# Patient Record
Sex: Female | Born: 1965
Health system: Southern US, Community
[De-identification: ages and names within clinical notes are randomized; demographics above are authoritative.]

## PROBLEM LIST (undated history)

## (undated) DIAGNOSIS — K219 Gastro-esophageal reflux disease without esophagitis: Secondary | ICD-10-CM

## (undated) DIAGNOSIS — J302 Other seasonal allergic rhinitis: Secondary | ICD-10-CM

## (undated) DIAGNOSIS — T148XXA Other injury of unspecified body region, initial encounter: Secondary | ICD-10-CM

## (undated) DIAGNOSIS — F419 Anxiety disorder, unspecified: Secondary | ICD-10-CM

## (undated) DIAGNOSIS — J189 Pneumonia, unspecified organism: Secondary | ICD-10-CM

## (undated) DIAGNOSIS — R51 Headache: Secondary | ICD-10-CM

## (undated) DIAGNOSIS — M1990 Unspecified osteoarthritis, unspecified site: Secondary | ICD-10-CM

## (undated) DIAGNOSIS — J45909 Unspecified asthma, uncomplicated: Secondary | ICD-10-CM

## (undated) DIAGNOSIS — R609 Edema, unspecified: Secondary | ICD-10-CM

## (undated) DIAGNOSIS — F32A Depression, unspecified: Secondary | ICD-10-CM

## (undated) DIAGNOSIS — F329 Major depressive disorder, single episode, unspecified: Secondary | ICD-10-CM

## (undated) HISTORY — PX: OTHER SURGICAL HISTORY: SHX169

## (undated) HISTORY — PX: CHOLECYSTECTOMY: SHX55

## (undated) HISTORY — PX: ABDOMINAL HYSTERECTOMY: SHX81

## (undated) HISTORY — PX: TONSILLECTOMY: SUR1361

---

## 2001-02-16 ENCOUNTER — Other Ambulatory Visit: Admission: RE | Admit: 2001-02-16 | Discharge: 2001-02-16 | Payer: Self-pay | Admitting: Obstetrics and Gynecology

## 2001-07-26 ENCOUNTER — Encounter: Payer: Self-pay | Admitting: Family Medicine

## 2001-07-26 ENCOUNTER — Ambulatory Visit (HOSPITAL_COMMUNITY): Admission: RE | Admit: 2001-07-26 | Discharge: 2001-07-26 | Payer: Self-pay | Admitting: Family Medicine

## 2002-12-22 ENCOUNTER — Ambulatory Visit (HOSPITAL_COMMUNITY): Admission: RE | Admit: 2002-12-22 | Discharge: 2002-12-22 | Payer: Self-pay | Admitting: Family Medicine

## 2002-12-22 ENCOUNTER — Encounter: Payer: Self-pay | Admitting: Family Medicine

## 2003-01-09 ENCOUNTER — Encounter: Payer: Self-pay | Admitting: Family Medicine

## 2003-01-09 ENCOUNTER — Ambulatory Visit (HOSPITAL_COMMUNITY): Admission: RE | Admit: 2003-01-09 | Discharge: 2003-01-09 | Payer: Self-pay | Admitting: Family Medicine

## 2003-01-18 ENCOUNTER — Ambulatory Visit (HOSPITAL_COMMUNITY): Admission: RE | Admit: 2003-01-18 | Discharge: 2003-01-18 | Payer: Self-pay | Admitting: Family Medicine

## 2003-01-18 ENCOUNTER — Encounter: Payer: Self-pay | Admitting: Family Medicine

## 2003-03-16 ENCOUNTER — Observation Stay (HOSPITAL_COMMUNITY): Admission: RE | Admit: 2003-03-16 | Discharge: 2003-03-17 | Payer: Self-pay | Admitting: Obstetrics and Gynecology

## 2003-04-04 ENCOUNTER — Encounter: Payer: Self-pay | Admitting: Obstetrics and Gynecology

## 2003-04-04 ENCOUNTER — Ambulatory Visit (HOSPITAL_COMMUNITY): Admission: RE | Admit: 2003-04-04 | Discharge: 2003-04-04 | Payer: Self-pay | Admitting: Obstetrics and Gynecology

## 2004-09-16 ENCOUNTER — Ambulatory Visit (HOSPITAL_COMMUNITY): Admission: RE | Admit: 2004-09-16 | Discharge: 2004-09-16 | Payer: Self-pay | Admitting: Internal Medicine

## 2005-03-06 ENCOUNTER — Ambulatory Visit (HOSPITAL_COMMUNITY): Admission: RE | Admit: 2005-03-06 | Discharge: 2005-03-06 | Payer: Self-pay | Admitting: Family Medicine

## 2005-03-06 ENCOUNTER — Ambulatory Visit: Payer: Self-pay | Admitting: Orthopedic Surgery

## 2005-04-02 ENCOUNTER — Ambulatory Visit: Payer: Self-pay | Admitting: Orthopedic Surgery

## 2006-03-09 ENCOUNTER — Ambulatory Visit: Payer: Self-pay | Admitting: Orthopedic Surgery

## 2006-04-13 ENCOUNTER — Ambulatory Visit: Payer: Self-pay | Admitting: Orthopedic Surgery

## 2006-04-14 ENCOUNTER — Encounter (HOSPITAL_COMMUNITY): Admission: RE | Admit: 2006-04-14 | Discharge: 2006-05-14 | Payer: Self-pay | Admitting: Orthopedic Surgery

## 2006-05-21 ENCOUNTER — Encounter (HOSPITAL_COMMUNITY): Admission: RE | Admit: 2006-05-21 | Discharge: 2006-05-23 | Payer: Self-pay | Admitting: Orthopedic Surgery

## 2006-05-25 ENCOUNTER — Ambulatory Visit: Payer: Self-pay | Admitting: Orthopedic Surgery

## 2006-06-09 ENCOUNTER — Encounter: Admission: RE | Admit: 2006-06-09 | Discharge: 2006-06-09 | Payer: Self-pay | Admitting: Orthopedic Surgery

## 2006-07-03 ENCOUNTER — Encounter: Admission: RE | Admit: 2006-07-03 | Discharge: 2006-07-03 | Payer: Self-pay | Admitting: Internal Medicine

## 2006-07-06 ENCOUNTER — Ambulatory Visit: Payer: Self-pay | Admitting: Orthopedic Surgery

## 2006-07-31 ENCOUNTER — Encounter: Admission: RE | Admit: 2006-07-31 | Discharge: 2006-07-31 | Payer: Self-pay | Admitting: Orthopedic Surgery

## 2006-08-04 ENCOUNTER — Encounter (HOSPITAL_COMMUNITY): Admission: RE | Admit: 2006-08-04 | Discharge: 2006-09-03 | Payer: Self-pay | Admitting: Endocrinology

## 2006-08-13 ENCOUNTER — Ambulatory Visit: Payer: Self-pay | Admitting: Orthopedic Surgery

## 2007-02-11 ENCOUNTER — Ambulatory Visit: Payer: Self-pay | Admitting: Orthopedic Surgery

## 2007-06-10 ENCOUNTER — Ambulatory Visit: Payer: Self-pay | Admitting: Orthopedic Surgery

## 2007-06-10 DIAGNOSIS — M76899 Other specified enthesopathies of unspecified lower limb, excluding foot: Secondary | ICD-10-CM | POA: Insufficient documentation

## 2007-11-05 ENCOUNTER — Ambulatory Visit (HOSPITAL_COMMUNITY): Admission: RE | Admit: 2007-11-05 | Discharge: 2007-11-05 | Payer: Self-pay | Admitting: Surgery

## 2007-11-23 ENCOUNTER — Encounter (HOSPITAL_COMMUNITY): Admission: RE | Admit: 2007-11-23 | Discharge: 2007-12-23 | Payer: Self-pay | Admitting: Surgery

## 2007-12-27 ENCOUNTER — Encounter (HOSPITAL_COMMUNITY): Admission: RE | Admit: 2007-12-27 | Discharge: 2008-01-26 | Payer: Self-pay | Admitting: Surgery

## 2008-05-12 ENCOUNTER — Encounter: Admission: RE | Admit: 2008-05-12 | Discharge: 2008-05-12 | Payer: Self-pay | Admitting: Family Medicine

## 2008-06-30 ENCOUNTER — Encounter: Admission: RE | Admit: 2008-06-30 | Discharge: 2008-06-30 | Payer: Self-pay | Admitting: Cardiology

## 2009-04-04 ENCOUNTER — Ambulatory Visit (HOSPITAL_COMMUNITY): Admission: RE | Admit: 2009-04-04 | Discharge: 2009-04-04 | Payer: Self-pay | Admitting: Family Medicine

## 2009-04-16 ENCOUNTER — Ambulatory Visit: Payer: Self-pay | Admitting: Orthopedic Surgery

## 2009-04-16 DIAGNOSIS — M25519 Pain in unspecified shoulder: Secondary | ICD-10-CM

## 2009-04-16 DIAGNOSIS — S40029A Contusion of unspecified upper arm, initial encounter: Secondary | ICD-10-CM

## 2009-05-08 ENCOUNTER — Encounter (HOSPITAL_COMMUNITY): Admission: RE | Admit: 2009-05-08 | Discharge: 2009-05-24 | Payer: Self-pay | Admitting: Orthopedic Surgery

## 2009-05-08 ENCOUNTER — Encounter: Payer: Self-pay | Admitting: Orthopedic Surgery

## 2009-05-28 ENCOUNTER — Encounter (HOSPITAL_COMMUNITY): Admission: RE | Admit: 2009-05-28 | Discharge: 2009-06-27 | Payer: Self-pay | Admitting: Orthopedic Surgery

## 2009-05-29 ENCOUNTER — Ambulatory Visit: Payer: Self-pay | Admitting: Orthopedic Surgery

## 2009-05-29 DIAGNOSIS — M7580 Other shoulder lesions, unspecified shoulder: Secondary | ICD-10-CM

## 2009-06-05 ENCOUNTER — Encounter: Payer: Self-pay | Admitting: Orthopedic Surgery

## 2009-06-27 ENCOUNTER — Ambulatory Visit (HOSPITAL_COMMUNITY): Admission: RE | Admit: 2009-06-27 | Discharge: 2009-06-27 | Payer: Self-pay | Admitting: Family Medicine

## 2009-07-02 ENCOUNTER — Ambulatory Visit (HOSPITAL_COMMUNITY): Admission: RE | Admit: 2009-07-02 | Discharge: 2009-07-02 | Payer: Self-pay | Admitting: Family Medicine

## 2009-07-11 ENCOUNTER — Encounter: Payer: Self-pay | Admitting: Orthopedic Surgery

## 2009-08-22 ENCOUNTER — Ambulatory Visit: Payer: Self-pay | Admitting: Orthopedic Surgery

## 2009-08-22 DIAGNOSIS — M25569 Pain in unspecified knee: Secondary | ICD-10-CM

## 2009-09-06 ENCOUNTER — Telehealth: Payer: Self-pay | Admitting: Orthopedic Surgery

## 2009-12-05 ENCOUNTER — Ambulatory Visit: Payer: Self-pay | Admitting: Orthopedic Surgery

## 2009-12-05 DIAGNOSIS — M5430 Sciatica, unspecified side: Secondary | ICD-10-CM

## 2009-12-05 DIAGNOSIS — M5126 Other intervertebral disc displacement, lumbar region: Secondary | ICD-10-CM

## 2009-12-10 ENCOUNTER — Telehealth: Payer: Self-pay | Admitting: Orthopedic Surgery

## 2009-12-12 ENCOUNTER — Ambulatory Visit (HOSPITAL_COMMUNITY): Admission: RE | Admit: 2009-12-12 | Discharge: 2009-12-12 | Payer: Self-pay | Admitting: Orthopedic Surgery

## 2009-12-18 ENCOUNTER — Ambulatory Visit: Payer: Self-pay | Admitting: Orthopedic Surgery

## 2009-12-18 DIAGNOSIS — M479 Spondylosis, unspecified: Secondary | ICD-10-CM | POA: Insufficient documentation

## 2010-01-02 ENCOUNTER — Encounter: Payer: Self-pay | Admitting: Orthopedic Surgery

## 2010-01-02 ENCOUNTER — Encounter (HOSPITAL_COMMUNITY): Admission: RE | Admit: 2010-01-02 | Discharge: 2010-02-01 | Payer: Self-pay | Admitting: Orthopedic Surgery

## 2010-02-05 ENCOUNTER — Encounter (HOSPITAL_COMMUNITY): Admission: RE | Admit: 2010-02-05 | Discharge: 2010-03-07 | Payer: Self-pay | Admitting: Orthopedic Surgery

## 2010-02-05 ENCOUNTER — Encounter (INDEPENDENT_AMBULATORY_CARE_PROVIDER_SITE_OTHER): Payer: Self-pay | Admitting: *Deleted

## 2010-03-04 ENCOUNTER — Encounter: Payer: Self-pay | Admitting: Orthopedic Surgery

## 2010-03-06 ENCOUNTER — Ambulatory Visit: Payer: Self-pay | Admitting: Orthopedic Surgery

## 2010-03-06 DIAGNOSIS — S93409A Sprain of unspecified ligament of unspecified ankle, initial encounter: Secondary | ICD-10-CM | POA: Insufficient documentation

## 2010-09-24 NOTE — Assessment & Plan Note (Signed)
Summary: RECK BACK AFTER PT/BCBS/BSF   Visit Type:  Follow-up  CC:  recheck Back.  History of Present Illness: I saw Danielle Montoya in the office today for a followup visit.  She is a 45 years old woman with the complaint of:  right hip pain and back pain   MRI [facility] 12/12/09  Neurontin 100 three times a day  The MRI shows that she has spondylosis with bulging disc at L4-L5 and then a disc protrusion at L5-S1. No central or foraminal narrowing.  Today is a recheck after PT.  PT discharge in EMR for no shows, went for several visits, did not help, did HEP.  No hip pain today, only right knee pain.  She has a brace on right ankle after she slipped and hit her ankle 3 weeks ago, used a cam walker she had and now in ASO which helps, just FYI.           Allergies: 1)  ! Penicillin 2)  ! Aspirin 3)  ! Erythromycin  Physical Exam  Additional Exam:  medial ankle tenderness and swelling stable ankle normal range of motion, motor 5 over 5 no instability ambulation normal sensation normal  RIGHT knee stable no crepitus full range of motion no swelling   Impression & Recommendations:  Problem # 1:  SPONDYLOSIS (ICD-721.90) Assessment Improved  continue same medications follow up as needed  Orders: Est. Patient Level III (21308)  Problem # 2:  ANKLE SPRAIN (ICD-845.00) Assessment: New  Ankle sprain no x-ray needed patient has improved  Orders: Est. Patient Level III (65784)  Patient Instructions: 1)  continue neurontin  2)  ankle should  be fine  Prescriptions: NEURONTIN 100 MG CAPS (GABAPENTIN) 1 by mouth three times a day  #60 x 2   Entered and Authorized by:   Fuller Canada MD   Signed by:   Fuller Canada MD on 03/06/2010   Method used:   Faxed to ...       [address] [phone]* (retail)       [address]       [place], [place]  [address]       Ph: [phone] or [phone]       Fax: [phone]   RxID:   [id number]

## 2010-09-24 NOTE — Progress Notes (Signed)
Summary: call from patient request for pain medication  Phone Note Call from Patient   Caller: Patient Summary of Call: Patient called to relay that Hydrocodone does not seem to help with shoulder and knee pain. Asking if any stronger medication can be presccribed.  Also asking her pharmacy to fax a request for the anti-inflammatory rx refill.  Cell # [phone] Initial call taken by: Cammie Sickle,  September 06, 2009 9:10 AM  Follow-up for Phone Call        no stronger medication but the nsaid should help Follow-up by: Fuller Canada MD,  September 06, 2009 9:59 AM  Additional Follow-up for Phone Call Additional follow up Details #1::        Advised patient. Additional Follow-up by: Cammie Sickle,  September 06, 2009 10:50 AM    Prescriptions: NABUMETONE 500 MG TABS (NABUMETONE) 1 by mouth two times a day  #60 x 1   Entered by:   Ether Griffins   Authorized by:   Fuller Canada MD   Signed by:   Ether Griffins on 09/06/2009   Method used:   Faxed to ...       [address] [phone]* (retail)       [address]       [place], [place]  [address]       Ph: [phone] or [phone]       Fax: [phone]   RxID:   [id number]

## 2010-09-24 NOTE — Progress Notes (Signed)
Summary: MRI appointment.  Phone Note Outgoing Call   Call placed by: Waldon Reining,  December 10, 2009 2:37 PM Call placed to: Patient Action Taken: Appt scheduled Summary of Call: I called to give the patient her MRI appointmnet at [facility] on 12-12-09 at 4:30. Patient has BCBS, authorization [phone] and it expires on 01-08-10. Patient will follow up here to go over her results.

## 2010-09-24 NOTE — Assessment & Plan Note (Signed)
Summary: MRI results from ap/frs   Visit Type:  Follow-up  CC:  L spine and hip pain.  History of Present Illness: I saw Danielle Montoya in the office today for a followup visit.  She is a 45 years old woman with the complaint of:  right hip pain and back pain   MRI [facility] 12/12/09 for review, L spine.  Neurontin 100 three times a day, helps some.  We have not done any physical therapy yet.  The MRI shows that she has spondylosis with bulging disc at L4-L5 and then a disc protrusion at L5-S1. No central or foraminal narrowing.  Recommend physical therapy for 6 weeks. Follow up in 8 weeks.      Allergies: 1)  ! Penicillin 2)  ! Aspirin 3)  ! Erythromycin   Impression & Recommendations:  Problem # 1:  SPONDYLOSIS (ICD-721.90) Assessment Comment Only  Orders: Est. Patient Level II ([address]109)  Other Orders: Physical Therapy Referral (PT)  Patient Instructions: 1)  PT L spine x 6 weeks  2)  Please schedule a follow-up appointment in 2 months.

## 2010-09-24 NOTE — Assessment & Plan Note (Signed)
Summary: RT HIP PAIN RAD DOWN NEEDS XR/BCBS/BSF   Visit Type:  Follow-up  CC:  right hip pain.  History of Present Illness: I saw Danielle Montoya in the office today for a followup visit.  She is a 45 years old woman with the complaint of:  right hip pain.  2008 was seen in our office for right hip pain, received injection for bursitis and started on Relafen 500bid and Tylenol 3, injection helped for bursitis.  No back pain.  Has pain in left hip that radiates to the left knee.  Vicodin 5 and Relafen 500mg  no relief.  No numbness or weakness in right leg, no groin pain.       Allergies: 1)  ! Penicillin 2)  ! Aspirin 3)  ! Erythromycin  Physical Exam  Additional Exam:   *GEN: appearance was normal   ** CDV: normal pulses temperature and no edema  * LYMPH nodes were normal   * SKIN was normal   * Neuro: normal sensation, reflexes normal. ** Psyche: AAO x 3 and mood was normal   MSK *Gait was normal  *Inspection straight-leg raise was positive. Nontender at the greater trochanter. Nontender in the lumbar spine. Motor exam is norma in the RIGHT lower extremity.      Impression & Recommendations:  Problem # 1:  SCIATICA (ICD-724.3) Assessment New  Her updated medication list for this problem includes:    Nabumetone 500 Mg Tabs (Nabumetone) .Marland Kitchen... 1 by mouth two times a day    Vicodin 5-500 Mg Tabs (Hydrocodone-acetaminophen) .Marland Kitchen... 1 by mouth q 4 p rn pain  Orders: Est. Patient Level IV (98119) Lumbosacral Spine ,2/3 views (72100)  Problem # 2:  H N P-LUMBAR (ICD-722.10) Assessment: New  Orders: Est. Patient Level IV (14782) Lumbosacral Spine ,2/3 views (72100) mild to moderate facet joint arthritic changes, spinal alignment relatively normal lordosis, maybe slightly increased.  Impression mild arthritis, lumbar spine  Medications Added to Medication List This Visit: 1)  Neurontin 100 Mg Caps (Gabapentin) .Marland Kitchen.. 1 by mouth three times a day  Patient  Instructions: 1)  MRI L spine / return for results  2)  New medication: Neurontin for the hip / kneepain  Prescriptions: NEURONTIN 100 MG CAPS (GABAPENTIN) 1 by mouth three times a day  #60 x 0   Entered and Authorized by:   Fuller Canada MD   Signed by:   Fuller Canada MD on 12/05/2009   Method used:   Print then Give to Patient   RxID:   [id number]

## 2010-09-24 NOTE — Miscellaneous (Signed)
Summary: PT Clinical evaluation  PT Clinical evaluation   Imported By: Jacklynn Ganong 01/07/2010 12:24:01  _____________________________________________________________________  External Attachment:    Type:   Image     Comment:   External Document

## 2010-09-24 NOTE — Letter (Signed)
Summary: *Orthopedic No Show Letter  [facility]  [address]  [place], [place] [address]   Phone: [phone]  Fax: [phone]      02/05/2010    Karena Kinker [address] [place], [place]  [address]     Dear Ms. Christena Flake,   Our records indicate that you missed your scheduled appointment with Dr. Beaulah Corin on 02/04/10.  Please contact this office to reschedule your appointment as soon as possible.  It is important that you keep your scheduled appointments with your physician, so we can provide you the best care possible.        Sincerely,    Dr. Terrance Mass, MD [facility] Phone [phone]

## 2010-09-24 NOTE — Miscellaneous (Signed)
Summary: Discharged from PT  Discharged from PT   Imported By: Jacklynn Ganong 03/06/2010 12:21:51  _____________________________________________________________________  External Attachment:    Type:   Image     Comment:   External Document

## 2011-01-10 NOTE — H&P (Signed)
NAME:  Danielle, Montoya                          ACCOUNT NO.:  [id number]   MEDICAL RECORD NO.:  [id number]                   PATIENT TYPE:  AMB   LOCATION:  DAY                                  FACILITY:  [facility]   PHYSICIAN:  Tilda Burrow, M.D.              DATE OF BIRTH:  [birth year]   DATE OF ADMISSION:  03/16/2003  DATE OF DISCHARGE:                                HISTORY & PHYSICAL   ADMISSION DIAGNOSES:  1. Recurrent right lower quadrant pain.  2. Cystic ovaries with adhesions.  3. Status post vaginal hysterectomy.   HISTORY OF PRESENT ILLNESS:  This 45 year old female has been seen back  several times over the past year for complaints of pelvic discomfort. She  was referred back by Dr. Phillips Odor after pelvic evaluation showed a right  ovarian cyst causing pain and discomfort.   The patient is seven years status post hysterectomy.  She did well for  several years and has had a new onset over the past three months of pelvic  discomfort. CT scan had shown some question of liver spots. MRI showed  essentially benign findings regarding the liver.   REVIEW OF SYSTEMS:  Positive for dyspareunia, prohibiting intimate sex since  May and the patient had examination in our office showing adnexal tenderness  which, on examination, is felt to be due to the closeness of the right ovary  to the vaginal cuff.  The pros and cons of surgical therapy have been  discussed and the patient declines efforts of further suppressing the  ovaries. Once previously, she was successful in suppressing the ovaries by  hormone therapy but this was discontinued and the pain recurred.  Additionally, the patient is a smoker and so the long term use of oral  contraceptives is considered undesirable.  Review of systems further is  negative for urinary symptoms. There is no stress or urge incontinence  symptoms and no rectocele complaints.   PAST MEDICAL HISTORY:  Benign surgical tonsillectomy at [facility].  Hysterectomy in 1996. Cholecystectomy in 1990.   ALLERGIES:  PENICILLIN.   MEDICATIONS:  None at present.   PHYSICAL EXAMINATION:  VITAL SIGNS:  Height 5 feet 10.5 inches, weight 180.  GENERAL:  Examination shows a tearful, energetic, Caucasian female who  appears her stated age.  HEENT:  Pupils equal, round and reactive.  NECK:  Supple. Trachea midline.  CHEST:  Clear to auscultation.  BREAST:  Normal breast examination March 14, 2003.  LUNGS:  Clear to auscultation.  ABDOMEN:  Nontender without masses at this time.  PELVIC:  Bimanual examination shows normal external genitalia. Vaginal  support is adequate. Cuff is tender on bimanual. This is due to suspected  uterine pressure.  EXTREMITIES:  Grossly normal.   IMPRESSION:  Symptomatic ovaries status post hysterectomy.   PLAN:  Laparoscopic removal of tubes and ovaries March 16, 2003.  Tilda Burrow, M.D.    JVF/MEDQ  D:  03/15/2003  T:  03/16/2003  Job:  [id number]   cc:   Corrie Mckusick, M.D.  [address]  [place]  [place] [address]  Fax: [phone]

## 2011-01-10 NOTE — Op Note (Signed)
NAME:  Danielle Montoya, Danielle Montoya                          ACCOUNT NO.:  [id number]   MEDICAL RECORD NO.:  [id number]                   PATIENT TYPE:  OBV   LOCATION:  A427                                 FACILITY:  [facility]   PHYSICIAN:  Tilda Burrow, M.D.              DATE OF BIRTH:  1965/12/28   DATE OF PROCEDURE:  03/16/2003  DATE OF DISCHARGE:  03/17/2003                                 OPERATIVE REPORT   PREOPERATIVE DIAGNOSES:  1. Recurrent pelvic pain, status post hysterectomy.  2. History of right ovarian cyst, suspected adhesions of pelvis.   POSTOPERATIVE DIAGNOSES:  1. Recurrent pelvic pain, status post hysterectomy.  2. History of right ovarian cyst, suspected adhesions of pelvis.   PROCEDURE:  Laparoscopic, bilateral salpingo-oophorectomy.   SURGEON:  Tilda Burrow, M.D.   ASSISTANT:  _____________, CST   ANESTHESIA:  General   COMPLICATIONS:  None   FINDINGS:  Relative normal appearing tubes and ovaries well up on the pelvic  sidewall, evidence of prior ovarian cysts.   DETAILS OF PROCEDURE:  The patient was taken to the operating room and  prepped and draped for combined abdominal and vaginal procedure with Foley  catheter in place, and a single-tooth tenaculum attached to the cuff for  manipulation and orientation.  The patient stated preoperatively that she  had not taken a bowel prep.   The patient had laparoscopic procedure performed first by placement of an  infraumbilical, vertical 1-cm skin incision with introduction of the Veress  needle and confirmation of intraperitoneal location by water droplet tet and  pneumoperitoneum under 12 mmHg pressure. The laparoscopic trocar was  introduced using a blunt laparoscopically guided trocar and we entered the  peritoneal cavity without difficulty or evidence of trauma.  Adhesions were  not identified.   The patient had suprapubic trocar site placed without difficulty and then  the right lower quadrant 5-mm port  was placed also.  The suprapubic port was  a 12-mm port.  We then proceeded to inspect the pelvis with the patient in  Trendelenburg position and elevated the bilateral pelvis with relative ease.   The right adnexa had some adhesions to the side of the sigmoid colon and  epiploic fat and these were taken down.  We then were able to inspect the  right adnexa and find the tube and ovary high upon the sidewall and somewhat  adherent.  The adhesions were primarily involving the tube and the ovary was  relatively free.   Nonetheless, due to symptomatology we decided to remove the tube and ovary.  The ureter could be identified on the right side through the peritoneal  surfaces and then we isolated the IP ligament and the round ligament;  transected the round ligament; and dissected free in the retroperitoneal  space sufficiently to isolate the IP ligament slightly, and then used the  harmonic scalpel to continue dissection by transecting the  IC ligaments, and  then dissected along the pelvic sidewall freeing the tube and ovary up  completely and removed the specimen.  Hemostasis was quite good.   The left tube and ovary were treated with similar fashion procedure and both  were removed by using EndoCatch bag and removed through the suprapubic site  without difficulty.   The patient then had irrigation of the pelvis, inspection for hemostasis  confirming good hemostasis and then we removed laparoscopic trocars, placed  a #0 Dexon suture at the fascial level at that suprapubic and the umbilical  site and then closed all 3 trocar sites with 4-0 Dexon at the skin level.  The patient tolerated procedure well and went to recovery room in good  condition.                                               Tilda Burrow, M.D.    JVF/MEDQ  D:  03/21/2003  T:  03/22/2003  Job:  [id number]

## 2011-03-06 ENCOUNTER — Other Ambulatory Visit (HOSPITAL_COMMUNITY): Payer: Self-pay | Admitting: Internal Medicine

## 2011-03-06 DIAGNOSIS — Z139 Encounter for screening, unspecified: Secondary | ICD-10-CM

## 2011-03-11 ENCOUNTER — Ambulatory Visit (HOSPITAL_COMMUNITY): Payer: BC Managed Care – PPO

## 2011-03-17 ENCOUNTER — Ambulatory Visit (HOSPITAL_COMMUNITY): Payer: BC Managed Care – PPO

## 2011-03-28 ENCOUNTER — Ambulatory Visit (HOSPITAL_COMMUNITY)
Admission: RE | Admit: 2011-03-28 | Discharge: 2011-03-28 | Disposition: A | Discharge status: Home / Self Care | Payer: BC Managed Care – PPO | Source: Ambulatory Visit | Attending: Internal Medicine | Admitting: Internal Medicine

## 2011-03-28 DIAGNOSIS — Z1231 Encounter for screening mammogram for malignant neoplasm of breast: Secondary | ICD-10-CM | POA: Insufficient documentation

## 2011-03-28 DIAGNOSIS — Z139 Encounter for screening, unspecified: Secondary | ICD-10-CM

## 2012-04-01 ENCOUNTER — Other Ambulatory Visit (HOSPITAL_COMMUNITY): Payer: Self-pay | Admitting: Physician Assistant

## 2012-04-01 ENCOUNTER — Encounter (INDEPENDENT_AMBULATORY_CARE_PROVIDER_SITE_OTHER): Payer: Self-pay | Admitting: *Deleted

## 2012-04-01 DIAGNOSIS — R51 Headache: Secondary | ICD-10-CM

## 2012-04-01 DIAGNOSIS — R42 Dizziness and giddiness: Secondary | ICD-10-CM

## 2012-04-05 ENCOUNTER — Ambulatory Visit (HOSPITAL_COMMUNITY)
Admission: RE | Admit: 2012-04-05 | Discharge: 2012-04-05 | Disposition: A | Discharge status: Home / Self Care | Payer: BC Managed Care – PPO | Source: Ambulatory Visit | Attending: Physician Assistant | Admitting: Physician Assistant

## 2012-04-05 DIAGNOSIS — R51 Headache: Secondary | ICD-10-CM | POA: Insufficient documentation

## 2012-04-05 DIAGNOSIS — R42 Dizziness and giddiness: Secondary | ICD-10-CM | POA: Insufficient documentation

## 2012-04-27 ENCOUNTER — Encounter (INDEPENDENT_AMBULATORY_CARE_PROVIDER_SITE_OTHER): Payer: Self-pay | Admitting: Internal Medicine

## 2012-04-27 ENCOUNTER — Other Ambulatory Visit (INDEPENDENT_AMBULATORY_CARE_PROVIDER_SITE_OTHER): Payer: Self-pay | Admitting: Internal Medicine

## 2012-04-27 ENCOUNTER — Ambulatory Visit (INDEPENDENT_AMBULATORY_CARE_PROVIDER_SITE_OTHER): Payer: BC Managed Care – PPO | Admitting: Internal Medicine

## 2012-04-27 VITALS — BP 138/82 | HR 64 | Temp 98.4°F | Ht 62.5 in | Wt 197.0 lb

## 2012-04-27 DIAGNOSIS — K921 Melena: Secondary | ICD-10-CM

## 2012-04-27 DIAGNOSIS — R197 Diarrhea, unspecified: Secondary | ICD-10-CM

## 2012-04-27 NOTE — Progress Notes (Signed)
Subjective:     Patient ID: Danielle Montoya, female   DOB: [birth year], 46 y.o.   MRN: [id number]  HPIReferred to our office by D[facility] Luo [facility] for diarrhea and blood in her stool.  Two weeks ago she had an eye infection and she was given Z-pack and then Doxycyline.  After taking antibiotics, she became sick. She began to vomit and have diarrhea. She was having approximately 10-15 loose stools.She could see blood in the toilet with her stools. The diarrhea lasted about 2 weeks and then cleared. Her stools now are loose. She is having  4 stools a day. Previously she was having 2 a day.  She tells me she is 50% better. She feels tired.  Stool studies were ordered by Robbie Lis but she did not obtain them. Her appetite has remained good. No weight loss.   Review of Systems see hpi No current outpatient prescriptions on file.   History reviewed. No pertinent past medical history. Past Surgical History  Procedure Date  . Cholecystectomy     dysfunctional  . Complete hysterectomy   . Tonsillectomy    History   Social History  . Marital Status: Married    Spouse Name: N/A    Number of Children: N/A  . Years of Education: N/A   Occupational History  . Not on file.   Social History Main Topics  . Smoking status: Current Everyday Smoker  . Smokeless tobacco: Not on file   Comment: 1/2 pack a day. Smoking since age 62.  Marland Kitchen Alcohol Use: No  . Drug Use: No  . Sexually Active: Not on file   Other Topics Concern  . Not on file   Social History Narrative  . No narrative on file   Family Status  Relation Status Death Age  . Mother Alive     Diabetic  . Father Deceased     MS  . Sister Alive     Good health. Both are diabetic  . Brother Alive     Open heart surgery age 22. Diabetic   Allergies  Allergen Reactions  . Aspirin   . Erythromycin   . Penicillins         Objective:   Physical Exam Filed Vitals:   04/27/12 1436  Height: 5' 2.5" (1.588 m)  Weight: 197 lb (89.359 kg)    Alert and oriented. Skin warm and dry. Oral mucosa is moist.   . Sclera anicteric, conjunctivae is pink. Thyroid not enlarged. No cervical lymphadenopathy. Lungs clear. Heart regular rate and rhythm.  Abdomen is soft. Bowel sounds are positive. No hepatomegaly. No abdominal masses felt. No tenderness. Stool brown and guaiac negative.  No edema to lower extremities.       Assessment:    Diarrhea.  C-difficile needs to be ruled out given recent hx of taking antibiotics. .  Mother had colon surgery in 2010 for adenoma with high grade dysplasia. May consider colonoscopy after stool studies are obtained.    Plan:    Stools studies. Further recommendations once we have stool studies back.   Will discuss with Dr .Karilyn Cota. May need a colonoscopy given family hx. I will discuss with Dr. Karilyn Cota.

## 2012-04-27 NOTE — Patient Instructions (Addendum)
Stool studies. Further recommendations once we have the results.

## 2012-04-28 ENCOUNTER — Encounter (INDEPENDENT_AMBULATORY_CARE_PROVIDER_SITE_OTHER): Payer: Self-pay | Admitting: Internal Medicine

## 2012-04-28 DIAGNOSIS — R197 Diarrhea, unspecified: Secondary | ICD-10-CM | POA: Insufficient documentation

## 2012-04-28 DIAGNOSIS — K921 Melena: Secondary | ICD-10-CM | POA: Insufficient documentation

## 2012-04-28 LAB — CLOSTRIDIUM DIFFICILE BY PCR: Toxigenic C. Difficile by PCR: NOT DETECTED

## 2012-04-28 LAB — OVA AND PARASITE SCREEN: OP: NONE SEEN

## 2012-04-28 LAB — FECAL LACTOFERRIN, QUANT: Lactoferrin: NEGATIVE

## 2012-05-01 LAB — STOOL CULTURE

## 2012-05-07 ENCOUNTER — Other Ambulatory Visit (INDEPENDENT_AMBULATORY_CARE_PROVIDER_SITE_OTHER): Payer: Self-pay | Admitting: *Deleted

## 2012-05-07 ENCOUNTER — Telehealth (INDEPENDENT_AMBULATORY_CARE_PROVIDER_SITE_OTHER): Payer: Self-pay | Admitting: *Deleted

## 2012-05-07 DIAGNOSIS — C189 Malignant neoplasm of colon, unspecified: Secondary | ICD-10-CM

## 2012-05-07 DIAGNOSIS — R195 Other fecal abnormalities: Secondary | ICD-10-CM

## 2012-05-07 DIAGNOSIS — Z800 Family history of malignant neoplasm of digestive organs: Secondary | ICD-10-CM

## 2012-05-07 MED ORDER — PEG-KCL-NACL-NASULF-NA ASC-C 100 G PO SOLR: 1.0000 | Freq: Once | ORAL | Status: DC

## 2012-05-07 NOTE — Telephone Encounter (Signed)
Patient needs movi prep 

## 2012-06-02 ENCOUNTER — Encounter (HOSPITAL_COMMUNITY): Payer: Self-pay | Admitting: Pharmacy Technician

## 2012-06-09 MED ORDER — SODIUM CHLORIDE 0.45 % IV SOLN
INTRAVENOUS | Status: DC
  Administered 2012-06-10: 11:00:00 via INTRAVENOUS

## 2012-06-10 ENCOUNTER — Encounter (HOSPITAL_COMMUNITY): Payer: Self-pay | Admitting: *Deleted

## 2012-06-10 ENCOUNTER — Encounter (HOSPITAL_COMMUNITY)
Admission: RE | Disposition: A | Discharge status: Home / Self Care | Payer: Self-pay | Source: Ambulatory Visit | Attending: Internal Medicine

## 2012-06-10 ENCOUNTER — Ambulatory Visit (HOSPITAL_COMMUNITY)
Admission: RE | Admit: 2012-06-10 | Discharge: 2012-06-10 | Disposition: A | Discharge status: Home / Self Care | Payer: BC Managed Care – PPO | Source: Ambulatory Visit | Attending: Internal Medicine | Admitting: Internal Medicine

## 2012-06-10 DIAGNOSIS — R197 Diarrhea, unspecified: Secondary | ICD-10-CM | POA: Insufficient documentation

## 2012-06-10 DIAGNOSIS — Z800 Family history of malignant neoplasm of digestive organs: Secondary | ICD-10-CM | POA: Insufficient documentation

## 2012-06-10 DIAGNOSIS — K644 Residual hemorrhoidal skin tags: Secondary | ICD-10-CM | POA: Insufficient documentation

## 2012-06-10 DIAGNOSIS — K5730 Diverticulosis of large intestine without perforation or abscess without bleeding: Secondary | ICD-10-CM | POA: Insufficient documentation

## 2012-06-10 DIAGNOSIS — K625 Hemorrhage of anus and rectum: Secondary | ICD-10-CM

## 2012-06-10 DIAGNOSIS — K5190 Ulcerative colitis, unspecified, without complications: Secondary | ICD-10-CM

## 2012-06-10 DIAGNOSIS — R195 Other fecal abnormalities: Secondary | ICD-10-CM

## 2012-06-10 DIAGNOSIS — K6389 Other specified diseases of intestine: Secondary | ICD-10-CM

## 2012-06-10 HISTORY — DX: Other seasonal allergic rhinitis: J30.2

## 2012-06-10 HISTORY — DX: Headache: R51

## 2012-06-10 HISTORY — PX: COLONOSCOPY: SHX5424

## 2012-06-10 HISTORY — DX: Gastro-esophageal reflux disease without esophagitis: K21.9

## 2012-06-10 HISTORY — DX: Other injury of unspecified body region, initial encounter: T14.8XXA

## 2012-06-10 SURGERY — COLONOSCOPY
Anesthesia: Moderate Sedation

## 2012-06-10 MED ORDER — MIDAZOLAM HCL 5 MG/5ML IJ SOLN
INTRAMUSCULAR | Status: AC
  Filled 2012-06-10: qty 10

## 2012-06-10 MED ORDER — MIDAZOLAM HCL 5 MG/5ML IJ SOLN
INTRAMUSCULAR | Status: DC | PRN
  Administered 2012-06-10 (×4): 2 mg via INTRAVENOUS

## 2012-06-10 MED ORDER — MEPERIDINE HCL 50 MG/ML IJ SOLN
INTRAMUSCULAR | Status: AC
  Filled 2012-06-10: qty 1

## 2012-06-10 MED ORDER — DICYCLOMINE HCL 10 MG PO CAPS: 10.0000 mg | ORAL_CAPSULE | Freq: Three times a day (TID) | ORAL | Status: DC

## 2012-06-10 MED ORDER — STERILE WATER FOR IRRIGATION IR SOLN
Status: DC | PRN
  Administered 2012-06-10: 11:00:00

## 2012-06-10 MED ORDER — MEPERIDINE HCL 50 MG/ML IJ SOLN
INTRAMUSCULAR | Status: DC | PRN
  Administered 2012-06-10 (×2): 25 mg via INTRAVENOUS

## 2012-06-10 NOTE — Op Note (Signed)
COLONOSCOPY PROCEDURE REPORT  PATIENT:  Danielle Montoya  MR#:  [id number] Birthdate:  May 15, 1966, 46 y.o., female Endoscopist:  Dr. Malissa Hippo, MD Referred By:  Dr. Madelin Rear. Sherwood Gambler, MD Procedure Date: 06/10/2012  Procedure:   Colonoscopy  Indications:  Patient is 46 year old Caucasian female with several week history of diarrhea negative stool studies. She also has noted intermittent rectal bleeding. Patient has history of thrombocytopenia. Her platelet count of 2 months ago was 165K per Dr. Sherwood Gambler.  Informed Consent:  The procedure and risks were reviewed with the patient and informed consent was obtained.  Medications:  Demerol 50 mg IV Versed 8 mg IV  Description of procedure:  After a digital rectal exam was performed, that colonoscope was advanced from the anus through the rectum and colon to the area of the cecum, ileocecal valve and appendiceal orifice. The cecum was deeply intubated. These structures were well-seen and photographed for the record. From the level of the cecum and ileocecal valve, the scope was slowly and cautiously withdrawn. The mucosal surfaces were carefully surveyed utilizing scope tip to flexion to facilitate fold flattening as needed. The scope was pulled down into the rectum where a thorough exam including retroflexion was performed. Terminal ileum was also examined.  Findings:   Prep excellent. Normal mucosa of terminal ileum. Scattered erosions noted at cecum and transverse colon. Biopsy taken from cecal mucosa with erosions. Few small diverticula at sigmoid colon. Normal rectal mucosa. Small hemorrhoids below the dentate line.  Therapeutic/Diagnostic Maneuvers Performed:  See above  Complications:  None  Cecal Withdrawal Time:  9 minutes  Impression:  Normal terminal ileum. Scattered erosions at cecum and transverse colon possibly residual from recent bout with colitis. Biopsy taken for routine histology. These changes could also be result  of NSAID therapy. Few small diverticula at sigmoid colon. Small external hemorrhoids.  Recommendations:  Dicyclomine 10 mg by mouth twice a day. Align or equivalent 1 capsule by mouth daily for one-month. I will contact patient with biopsy results and further recommendations.  REHMAN,NAJEEB U  06/10/2012 11:52 AM  CC: Dr. Cassell Smiles., MD & Dr. Bonnetta Barry ref. provider found

## 2012-06-10 NOTE — H&P (Signed)
Danielle Montoya is an 46 y.o. female.   Chief Complaint: Patient is here for colonoscopy. HPI: Patient is 46 year old Caucasian female with several week history of diarrhea and intermittent bleeding. Diarrhea started when she was given Z-Pak followed by doxycycline for eye infection. At one point she was having several bowel movements per day. She has improved but diarrhea has not gone away completely. She is having 4-5 stools per day. Her stool studies been negative including C. difficile toxin titer. She denies abdominal pain anorexia or weight loss. Patient is taking diclofenac for torn muscle given to her by orthopedic surgeon and she would eventually come off this medication. Patient also gives history of thrombocytopenia since 1997. She tells me she had thorough evaluation at [facility] and etiology not determined. Family history is negative for inflammatory bowel disease or CRC.  Past Medical History  Diagnosis Date  . Torn muscle     Left arm  . GERD (gastroesophageal reflux disease)   . Seasonal allergies   . Headache     Migraines    Past Surgical History  Procedure Date  . Cholecystectomy     dysfunctional  . Tonsillectomy   . Abdominal hysterectomy     Family History  Problem Relation Age of Onset  . Colon cancer Mother    Social History:  reports that she has been smoking Cigarettes.  She has a 16 pack-year smoking history. She does not have any smokeless tobacco history on file. She reports that she does not drink alcohol or use illicit drugs.  Allergies:  Allergies  Allergen Reactions  . Shellfish Allergy Nausea And Vomiting    All seafood causes patient to be extremely sick.  . Aspirin Nausea And Vomiting and Rash  . Erythromycin Nausea And Vomiting and Rash  . Penicillins Nausea And Vomiting and Rash    Medications Prior to Admission  Medication Sig Dispense Refill  . cyclobenzaprine (FLEXERIL) 10 MG tablet Take 10 mg by mouth at bedtime.      .  diclofenac (VOLTAREN) 75 MG EC tablet Take 75 mg by mouth 2 (two) times daily.      . fluticasone (FLONASE) 50 MCG/ACT nasal spray Place 2 sprays into the nose daily as needed.      . peg 3350 powder (MOVIPREP) 100 G SOLR Take 1 kit (100 g total) by mouth once.  1 kit  0  . topiramate (TOPAMAX) 25 MG tablet Take 75 mg by mouth at bedtime.      Marland Kitchen HYDROcodone-acetaminophen (NORCO/VICODIN) 5-325 MG per tablet Take 1 tablet by mouth every 6 (six) hours as needed. Pain        No results found for this or any previous visit (from the past 48 hour(s)). No results found.  ROS  Blood pressure 147/99, pulse 88, temperature 98 F (36.7 C), temperature source Oral, resp. rate 17, height 5' 2.5" (1.588 m), weight 197 lb (89.359 kg), SpO2 96.00%. Physical Exam  Constitutional: She appears well-developed and well-nourished.  HENT:  Mouth/Throat: Oropharynx is clear and moist.  Eyes: Conjunctivae normal are normal. No scleral icterus.  Neck: No thyromegaly present.  Cardiovascular: Normal rate, regular rhythm and normal heart sounds.   No murmur heard. Respiratory: Effort normal and breath sounds normal.  GI: Soft. She exhibits no mass. There is no tenderness.  Musculoskeletal: She exhibits no edema.  Lymphadenopathy:    She has no cervical adenopathy.  Neurological: She is alert.  Skin: Skin is warm and dry.  Assessment/Plan Persistent diarrhea despite negative stool studies. Rectal bleeding. Diagnostic colonoscopy.  Khi Mcmillen U 06/10/2012, 11:22 AM

## 2012-06-17 ENCOUNTER — Encounter (HOSPITAL_COMMUNITY): Payer: Self-pay | Admitting: Internal Medicine

## 2012-06-18 ENCOUNTER — Encounter (INDEPENDENT_AMBULATORY_CARE_PROVIDER_SITE_OTHER): Payer: Self-pay | Admitting: *Deleted

## 2012-06-18 NOTE — Progress Notes (Signed)
Apt has been scheduled for 08/16/12 with Dr. Karilyn Cota.

## 2012-08-16 ENCOUNTER — Ambulatory Visit (INDEPENDENT_AMBULATORY_CARE_PROVIDER_SITE_OTHER): Payer: BC Managed Care – PPO | Admitting: Internal Medicine

## 2012-08-16 ENCOUNTER — Encounter (INDEPENDENT_AMBULATORY_CARE_PROVIDER_SITE_OTHER): Payer: Self-pay | Admitting: Internal Medicine

## 2012-08-16 VITALS — BP 140/80 | HR 68 | Temp 98.4°F | Resp 16 | Ht 62.0 in | Wt 194.9 lb

## 2012-08-16 DIAGNOSIS — K219 Gastro-esophageal reflux disease without esophagitis: Secondary | ICD-10-CM

## 2012-08-16 DIAGNOSIS — R197 Diarrhea, unspecified: Secondary | ICD-10-CM

## 2012-08-16 MED ORDER — DICYCLOMINE HCL 10 MG PO CAPS: 10.0000 mg | ORAL_CAPSULE | Freq: Three times a day (TID) | ORAL | Status: DC

## 2012-08-16 MED ORDER — PANTOPRAZOLE SODIUM 40 MG PO TBEC: 40.0000 mg | DELAYED_RELEASE_TABLET | Freq: Every day | ORAL | Status: DC

## 2012-08-16 NOTE — Progress Notes (Signed)
Presenting complaint;  Followup for diarrhea. Patient complains of frequent heartburn.  Subjective:  Patient is 46 year old Caucasian female who is evaluated for diarrhea and rectal bleeding. Symptoms started after she was treated with antibiotic but her stool studies were negative. On 06/10/2012 she underwent colonoscopy revealing normal terminal ileum scattered erosions at cecum and transverse colon and few small diverticula at sigmoid colon as well as small external hemorrhoids. Biopsy revealed nonspecific inflammation. This injury was felt to be either secondary to NSAIDs or an infection. She was begun on dicyclomine. She states she is feeling better. She generally has 3 bowel movements per day. Stool consistency varies from loose to formed. She denies rectal bleeding or abdominal cramps. Now she is having frequent heartburn. She is now taking Celebrex for torn: muscle at left forearm. She is presently on medical leave.  Current Medications: Current Outpatient Prescriptions  Medication Sig Dispense Refill  . celecoxib (CELEBREX) 200 MG capsule Take 200 mg by mouth 2 (two) times daily.      Marland Kitchen dicyclomine (BENTYL) 10 MG capsule Take 1 capsule (10 mg total) by mouth 4 (four) times daily -  before meals and at bedtime.  60 capsule  5  . fluticasone (FLONASE) 50 MCG/ACT nasal spray Place 2 sprays into the nose daily as needed.      Marland Kitchen oxyCODONE-acetaminophen (PERCOCET) 10-650 MG per tablet Take 1 tablet by mouth every 6 (six) hours as needed.      . topiramate (TOPAMAX) 25 MG tablet Take 75 mg by mouth at bedtime.         Objective: Blood pressure 140/80, pulse 68, temperature 98.4 F (36.9 C), temperature source Oral, resp. rate 16, height 5\' 2"  (1.575 m), weight 194 lb 14.4 oz (88.406 kg). Conjunctiva is pink. Sclera is nonicteric Oropharyngeal mucosa is normal. No neck masses or thyromegaly noted. Abdomen is full but soft and nontender without organomegaly or masses. No LE edema or  clubbing noted.  she has cast overleft forearm and elbow.   Assessment:  #1. Diarrhea. She has improved with anti-spasmodic therapy. In retrospect she there had an infection or NSAID colopathy. #2.GERD. Suspect symptoms secondary to medications.   Plan:  Pantoprazole 40 mg by mouth every morning. Decrease dicyclomine to 10 mg by mouth 3 times a day. Check with your orthopedic surgeon if Celebrex does could be reduced. Office visit on as-needed basis.

## 2012-08-16 NOTE — Patient Instructions (Addendum)
New medication is pantoprazole 40 mg by mouth 30 minutes for breakfast daily. Dicyclomine dose decreased to 10 mg before each meal. You can drop dose further as diarrhea improves. Please talk with your orthopedic surgeon if Celebrex dose could be reduced.

## 2013-10-01 ENCOUNTER — Emergency Department (HOSPITAL_COMMUNITY): Payer: BC Managed Care – PPO

## 2013-10-01 ENCOUNTER — Encounter (HOSPITAL_COMMUNITY): Payer: Self-pay | Admitting: Emergency Medicine

## 2013-10-01 ENCOUNTER — Emergency Department (HOSPITAL_COMMUNITY)
Admission: EM | Admit: 2013-10-01 | Discharge: 2013-10-02 | Disposition: A | Discharge status: Home / Self Care | Payer: BC Managed Care – PPO | Attending: Emergency Medicine | Admitting: Emergency Medicine

## 2013-10-01 DIAGNOSIS — Z90710 Acquired absence of both cervix and uterus: Secondary | ICD-10-CM | POA: Insufficient documentation

## 2013-10-01 DIAGNOSIS — R109 Unspecified abdominal pain: Secondary | ICD-10-CM

## 2013-10-01 DIAGNOSIS — Z880 Allergy status to penicillin: Secondary | ICD-10-CM | POA: Insufficient documentation

## 2013-10-01 DIAGNOSIS — Z8719 Personal history of other diseases of the digestive system: Secondary | ICD-10-CM | POA: Insufficient documentation

## 2013-10-01 DIAGNOSIS — Z87828 Personal history of other (healed) physical injury and trauma: Secondary | ICD-10-CM | POA: Insufficient documentation

## 2013-10-01 DIAGNOSIS — Z9089 Acquired absence of other organs: Secondary | ICD-10-CM | POA: Insufficient documentation

## 2013-10-01 DIAGNOSIS — E669 Obesity, unspecified: Secondary | ICD-10-CM | POA: Insufficient documentation

## 2013-10-01 DIAGNOSIS — N390 Urinary tract infection, site not specified: Secondary | ICD-10-CM | POA: Insufficient documentation

## 2013-10-01 DIAGNOSIS — G43909 Migraine, unspecified, not intractable, without status migrainosus: Secondary | ICD-10-CM | POA: Insufficient documentation

## 2013-10-01 DIAGNOSIS — R61 Generalized hyperhidrosis: Secondary | ICD-10-CM | POA: Insufficient documentation

## 2013-10-01 DIAGNOSIS — F17200 Nicotine dependence, unspecified, uncomplicated: Secondary | ICD-10-CM | POA: Insufficient documentation

## 2013-10-01 LAB — CBC WITH DIFFERENTIAL/PLATELET
BASOS ABS: 0 10*3/uL (ref 0.0–0.1)
BASOS PCT: 0 % (ref 0–1)
EOS ABS: 0.1 10*3/uL (ref 0.0–0.7)
EOS PCT: 1 % (ref 0–5)
HEMATOCRIT: 39.9 % (ref 36.0–46.0)
Hemoglobin: 13.2 g/dL (ref 12.0–15.0)
Lymphocytes Relative: 50 % — ABNORMAL HIGH (ref 12–46)
Lymphs Abs: 5 10*3/uL — ABNORMAL HIGH (ref 0.7–4.0)
MCH: 31.1 pg (ref 26.0–34.0)
MCHC: 33.1 g/dL (ref 30.0–36.0)
MCV: 94.1 fL (ref 78.0–100.0)
MONO ABS: 0.6 10*3/uL (ref 0.1–1.0)
Monocytes Relative: 6 % (ref 3–12)
Neutro Abs: 4.2 10*3/uL (ref 1.7–7.7)
Neutrophils Relative %: 42 % — ABNORMAL LOW (ref 43–77)
PLATELETS: 185 10*3/uL (ref 150–400)
RBC: 4.24 MIL/uL (ref 3.87–5.11)
RDW: 12.8 % (ref 11.5–15.5)
WBC: 9.9 10*3/uL (ref 4.0–10.5)

## 2013-10-01 LAB — COMPREHENSIVE METABOLIC PANEL
ALK PHOS: 94 U/L (ref 39–117)
ALT: 20 U/L (ref 0–35)
AST: 15 U/L (ref 0–37)
Albumin: 3.7 g/dL (ref 3.5–5.2)
BILIRUBIN TOTAL: 0.2 mg/dL — AB (ref 0.3–1.2)
BUN: 10 mg/dL (ref 6–23)
CHLORIDE: 107 meq/L (ref 96–112)
CO2: 23 meq/L (ref 19–32)
CREATININE: 0.73 mg/dL (ref 0.50–1.10)
Calcium: 9.3 mg/dL (ref 8.4–10.5)
GFR calc Af Amer: >90 mL/min (ref >90)
Glucose, Bld: 107 mg/dL — ABNORMAL HIGH (ref 70–99)
POTASSIUM: 3.7 meq/L (ref 3.7–5.3)
Sodium: 143 mEq/L (ref 137–147)
Total Protein: 7.4 g/dL (ref 6.0–8.3)

## 2013-10-01 LAB — URINE MICROSCOPIC-ADD ON

## 2013-10-01 LAB — URINALYSIS, ROUTINE W REFLEX MICROSCOPIC
BILIRUBIN URINE: NEGATIVE
Glucose, UA: NEGATIVE mg/dL
Hgb urine dipstick: NEGATIVE
KETONES UR: NEGATIVE mg/dL
LEUKOCYTES UA: NEGATIVE
NITRITE: POSITIVE — AB
PROTEIN: NEGATIVE mg/dL
Specific Gravity, Urine: 1.01 (ref 1.005–1.030)
Urobilinogen, UA: 0.2 mg/dL (ref 0.0–1.0)
pH: 6 (ref 5.0–8.0)

## 2013-10-01 LAB — LIPASE, BLOOD: Lipase: 30 U/L (ref 11–59)

## 2013-10-01 MED ORDER — HYDROMORPHONE HCL PF 1 MG/ML IJ SOLN
1.0000 mg | Freq: Once | INTRAMUSCULAR | Status: AC
  Administered 2013-10-01: 1 mg via INTRAVENOUS
  Filled 2013-10-01: qty 1

## 2013-10-01 MED ORDER — CIPROFLOXACIN HCL 500 MG PO TABS: 500.0000 mg | ORAL_TABLET | Freq: Two times a day (BID) | ORAL | Status: DC

## 2013-10-01 MED ORDER — HYDROCODONE-ACETAMINOPHEN 5-325 MG PO TABS: 1.0000 | ORAL_TABLET | Freq: Four times a day (QID) | ORAL | Status: DC | PRN

## 2013-10-01 MED ORDER — CIPROFLOXACIN HCL 250 MG PO TABS
500.0000 mg | ORAL_TABLET | Freq: Once | ORAL | Status: AC
  Administered 2013-10-01: 500 mg via ORAL
  Filled 2013-10-01: qty 2

## 2013-10-01 MED ORDER — ONDANSETRON HCL 4 MG/2ML IJ SOLN
4.0000 mg | Freq: Once | INTRAMUSCULAR | Status: AC
  Administered 2013-10-01: 4 mg via INTRAVENOUS
  Filled 2013-10-01: qty 2

## 2013-10-01 MED ORDER — SODIUM CHLORIDE 0.9 % IV BOLUS (SEPSIS)
1000.0000 mL | Freq: Once | INTRAVENOUS | Status: AC
  Administered 2013-10-01: 1000 mL via INTRAVENOUS

## 2013-10-01 MED ORDER — SODIUM CHLORIDE 0.9 % IV SOLN
INTRAVENOUS | Status: DC
  Administered 2013-10-01: 22:00:00 via INTRAVENOUS

## 2013-10-01 MED ORDER — IOHEXOL 300 MG/ML  SOLN
100.0000 mL | Freq: Once | INTRAMUSCULAR | Status: AC | PRN
  Administered 2013-10-01: 100 mL via INTRAVENOUS

## 2013-10-01 MED ORDER — ONDANSETRON 4 MG PO TBDP: 4.0000 mg | ORAL_TABLET | Freq: Three times a day (TID) | ORAL | Status: DC | PRN

## 2013-10-01 MED ORDER — IOHEXOL 300 MG/ML  SOLN: 50.0000 mL | Freq: Once | INTRAMUSCULAR | Status: AC | PRN

## 2013-10-01 NOTE — ED Provider Notes (Signed)
CSN: [id number]     Arrival date & time 10/01/13  1744 History  This chart was scribed for Mervin Kung, MD by Zettie Pho, ED Scribe. This patient was seen in room [facility]18/[facility]18 and the patient's care was started at 9:03 PM.    Chief Complaint  Patient presents with  . Abdominal Pain   Patient is a 48 y.o. female presenting with abdominal pain. The history is provided by the patient. No language interpreter was used.  Abdominal Pain Pain location:  RLQ Pain radiates to:  Does not radiate Pain severity:  Severe Onset quality:  Gradual Timing:  Constant Progression:  Worsening Chronicity:  New Ineffective treatments:  None tried Associated symptoms: nausea   Associated symptoms: no chest pain, no chills, no cough, no diarrhea, no dysuria, no fever, no shortness of breath, no sore throat and no vomiting   Nausea:    Severity:  Moderate   Onset quality:  Gradual   Timing:  Constant   Progression:  Worsening Risk factors: multiple surgeries and obesity    HPI Comments: Danielle Montoya is a 48 y.o. Female with a history of GERD who presents to the Emergency Department complaining of a constant, non-radiating pain to the RLQ of the abdomen, 10/10 currently, with associated nausea and diaphoresis onset early this morning, around 19 hours ago, that has been progressively worsening. She reports some diffuse headaches for the past several days. She denies emesis, diarrhea, dysuria. She denies fever, chills, cough, rhinorrhea, sore throat, visual disturbance, chest pain, shortness of breath, leg swelling, back pain, neck pain, rash, history of bleeding easily. Patient has a history of cholecystectomy and hysterectomy. Patient has no other pertinent medical history.   Past Medical History  Diagnosis Date  . Torn muscle     Left arm  . GERD (gastroesophageal reflux disease)   . Seasonal allergies   . Headache(784.0)     Migraines   Past Surgical History  Procedure Laterality Date  .  Cholecystectomy      dysfunctional  . Tonsillectomy    . Abdominal hysterectomy    . Colonoscopy  06/10/2012    Procedure: COLONOSCOPY;  Surgeon: Rogene Houston, MD;  Location: [facility];  Service: Endoscopy;  Laterality: N/A;  1200   Family History  Problem Relation Age of Onset  . Colon cancer Mother    History  Substance Use Topics  . Smoking status: Current Every Day Smoker -- 0.50 packs/day for 32 years    Types: Cigarettes  . Smokeless tobacco: Never Used     Comment: 1/2 pack a day. Smoking since age 89.  Marland Kitchen Alcohol Use: No   OB History   Grav Para Term Preterm Abortions TAB SAB Ect Mult Living                 Review of Systems  Constitutional: Positive for diaphoresis. Negative for fever and chills.  HENT: Negative for rhinorrhea and sore throat.   Eyes: Negative for visual disturbance.  Respiratory: Negative for cough and shortness of breath.   Cardiovascular: Negative for chest pain and leg swelling.  Gastrointestinal: Positive for nausea and abdominal pain. Negative for vomiting and diarrhea.  Genitourinary: Negative for dysuria.  Skin: Negative for rash.  Neurological: Positive for headaches.  Hematological: Does not bruise/bleed easily.  Psychiatric/Behavioral: Negative for confusion.    Allergies  Penicillins; Shellfish allergy; Aspirin; and Erythromycin  Home Medications   Current Outpatient Rx  Name  Route  Sig  Dispense  Refill  . butalbital-acetaminophen-caffeine (FIORICET, ESGIC) 50-325-40 MG per tablet   Oral   Take 1 tablet by mouth every 6 (six) hours as needed for headache or migraine.         . fluticasone (FLONASE) 50 MCG/ACT nasal spray   Nasal   Place 2 sprays into the nose daily as needed for allergies.          Marland Kitchen topiramate (TOPAMAX) 25 MG tablet   Oral   Take 100 mg by mouth at bedtime.          . ciprofloxacin (CIPRO) 500 MG tablet   Oral   Take 1 tablet (500 mg total) by mouth 2 (two) times daily.   14 tablet   0    . HYDROcodone-acetaminophen (NORCO/VICODIN) 5-325 MG per tablet   Oral   Take 1-2 tablets by mouth every 6 (six) hours as needed for moderate pain.   14 tablet   0   . ondansetron (ZOFRAN ODT) 4 MG disintegrating tablet   Oral   Take 1 tablet (4 mg total) by mouth every 8 (eight) hours as needed.   10 tablet   0    Triage Vitals: BP 147/93  Pulse 86  Temp(Src) 98 F (36.7 C) (Oral)  Resp 20  Ht 5' 2.5" (1.588 m)  Wt 195 lb (88.451 kg)  BMI 35.08 kg/m2  SpO2 98%  Physical Exam  Nursing note and vitals reviewed. Constitutional: She is oriented to person, place, and time. She appears well-developed and well-nourished. No distress.  HENT:  Head: Normocephalic and atraumatic.  Mouth/Throat: Oropharynx is clear and moist.  Eyes: Conjunctivae and EOM are normal.  Neck: Normal range of motion. Neck supple.  Cardiovascular: Normal rate, regular rhythm and normal heart sounds.   Pulmonary/Chest: Effort normal and breath sounds normal. No respiratory distress.  Abdominal: Soft. Bowel sounds are normal. She exhibits no distension. There is tenderness. There is guarding.  Tenderness to palpation to the RLQ with guarding.   Musculoskeletal: Normal range of motion. She exhibits no edema.  Neurological: She is alert and oriented to person, place, and time. No cranial nerve deficit. She exhibits normal muscle tone. Coordination normal.  Skin: Skin is warm and dry.  Psychiatric: She has a normal mood and affect. Her behavior is normal.    ED Course  Procedures (including critical care time)  DIAGNOSTIC STUDIES: Oxygen Saturation is 98% on room air, normal by my interpretation.    COORDINATION OF CARE: 9:08 PM- Will order a CT of the abdomen to rule out appendicitis. Will order blood labs and UA. Will order IV fluids, Zofran, and Dilaudid to manage symptoms. Discussed treatment plan with patient at bedside and patient verbalized agreement.    Medications  0.9 %  sodium chloride  infusion ( Intravenous New Bag/Given 10/01/13 2131)  iohexol (OMNIPAQUE) 300 MG/ML solution 50 mL (not administered)  ciprofloxacin (CIPRO) tablet 500 mg (not administered)  sodium chloride 0.9 % bolus 1,000 mL (0 mLs Intravenous Stopped 10/01/13 2232)  ondansetron (ZOFRAN) injection 4 mg (4 mg Intravenous Given 10/01/13 2131)  HYDROmorphone (DILAUDID) injection 1 mg (1 mg Intravenous Given 10/01/13 2131)  HYDROmorphone (DILAUDID) injection 1 mg (1 mg Intravenous Given 10/01/13 2303)  iohexol (OMNIPAQUE) 300 MG/ML solution 100 mL (100 mLs Intravenous Contrast Given 10/01/13 2256)   Results for orders placed during the hospital encounter of 10/01/13  COMPREHENSIVE METABOLIC PANEL      Result Value Range   Sodium 143  137 - 147 mEq/L  Potassium 3.7  3.7 - 5.3 mEq/L   Chloride 107  96 - 112 mEq/L   CO2 23  19 - 32 mEq/L   Glucose, Bld 107 (*) 70 - 99 mg/dL   BUN 10  6 - 23 mg/dL   Creatinine, Ser 0.73  0.50 - 1.10 mg/dL   Calcium 9.3  8.4 - 10.5 mg/dL   Total Protein 7.4  6.0 - 8.3 g/dL   Albumin 3.7  3.5 - 5.2 g/dL   AST 15  0 - 37 U/L   ALT 20  0 - 35 U/L   Alkaline Phosphatase 94  39 - 117 U/L   Total Bilirubin 0.2 (*) 0.3 - 1.2 mg/dL   GFR calc non Af Amer >90  >90 mL/min   GFR calc Af Amer >90  >90 mL/min  LIPASE, BLOOD      Result Value Range   Lipase 30  11 - 59 U/L  CBC WITH DIFFERENTIAL      Result Value Range   WBC 9.9  4.0 - 10.5 K/uL   RBC 4.24  3.87 - 5.11 MIL/uL   Hemoglobin 13.2  12.0 - 15.0 g/dL   HCT 39.9  36.0 - 46.0 %   MCV 94.1  78.0 - 100.0 fL   MCH 31.1  26.0 - 34.0 pg   MCHC 33.1  30.0 - 36.0 g/dL   RDW 12.8  11.5 - 15.5 %   Platelets 185  150 - 400 K/uL   Neutrophils Relative % 42 (*) 43 - 77 %   Neutro Abs 4.2  1.7 - 7.7 K/uL   Lymphocytes Relative 50 (*) 12 - 46 %   Lymphs Abs 5.0 (*) 0.7 - 4.0 K/uL   Monocytes Relative 6  3 - 12 %   Monocytes Absolute 0.6  0.1 - 1.0 K/uL   Eosinophils Relative 1  0 - 5 %   Eosinophils Absolute 0.1  0.0 - 0.7 K/uL    Basophils Relative 0  0 - 1 %   Basophils Absolute 0.0  0.0 - 0.1 K/uL  URINALYSIS, ROUTINE W REFLEX MICROSCOPIC      Result Value Range   Color, Urine YELLOW  YELLOW   APPearance HAZY (*) CLEAR   Specific Gravity, Urine 1.010  1.005 - 1.030   pH 6.0  5.0 - 8.0   Glucose, UA NEGATIVE  NEGATIVE mg/dL   Hgb urine dipstick NEGATIVE  NEGATIVE   Bilirubin Urine NEGATIVE  NEGATIVE   Ketones, ur NEGATIVE  NEGATIVE mg/dL   Protein, ur NEGATIVE  NEGATIVE mg/dL   Urobilinogen, UA 0.2  0.0 - 1.0 mg/dL   Nitrite POSITIVE (*) NEGATIVE   Leukocytes, UA NEGATIVE  NEGATIVE  URINE MICROSCOPIC-ADD ON      Result Value Range   Squamous Epithelial / LPF FEW (*) RARE   WBC, UA 0-2  <3 WBC/hpf   Bacteria, UA MANY (*) RARE   Ct Abdomen Pelvis W Contrast  10/01/2013   CLINICAL DATA:  Right lower quadrant abdominal pain, nausea and vomiting. History of cholecystectomy and hysterectomy.  EXAM: CT ABDOMEN AND PELVIS WITH CONTRAST  TECHNIQUE: Multidetector CT imaging of the abdomen and pelvis was performed using the standard protocol following bolus administration of intravenous contrast.  CONTRAST:  120mL OMNIPAQUE IOHEXOL 300 MG/ML  SOLN  COMPARISON:  Reports from 04/04/2003 abdominal CT and 01/18/2003 abdominal MRI. Images from the studies were not available for review at the time of dictation.  FINDINGS: The visualized lung bases are clear.  There are 2 lesions in the posterior right hepatic lobe which measure 4.3 x 3.8 cm and 3.2 x 1.3 cm. These demonstrate nodules of discontinuous peripheral enhancement, compatible with hemangiomas as described on prior studies. The gallbladder is surgically absent. The common bile duct is mildly dilated, measuring 1.1 cm in diameter, likely related to prior cholecystectomy. The spleen, left adrenal gland, kidneys, and pancreas are unremarkable. Intermediate density lesions in the right adrenal gland measure 1.4 cm (series 2, image 25) and 1.0 cm (series 2, image 28).  The stomach  is moderately distended with oral contrast. Oral contrast is present within multiple nondilated loops of proximal and mid small bowel. The appendix is identified in the right lower quadrant and is unremarkable. There are a few scattered colonic diverticula without evidence of diverticulitis. No free fluid or enlarged lymph nodes are identified in the abdomen or pelvis.  The uterus is absent the ovaries are not identified. Mild, scattered atherosclerotic calcification is noted involving the infrarenal abdominal aorta and iliac arteries. Bladder is unremarkable. Sclerotic foci in the sacrum and right acetabulum are nonspecific but likely represent bone islands.  IMPRESSION: 1. No acute abnormality identified in the abdomen or pelvis. 2. Hepatic hemangiomata. 3. 1.0 and 1.4 cm right adrenal lesions, indeterminate. Without a history of prior malignancy, these are most likely benign. A follow-up CT or MRI could be considered in 6-12 months.   Electronically Signed   By: Logan Bores   On: 10/01/2013 23:24       EKG Interpretation   None       MDM   1. Abdominal pain   2. UTI (lower urinary tract infection)    Workup for the abdominal pain without any significant findings. Urinalysis does have positive nitrite so we'll treat for urinary tract infection. Patient made aware of the adrenal finding and they will followup with primary care Dr. in 6 months. No evidence of appendicitis. No significant leukocytosis. Patient stable for discharge home.  I personally performed the services described in this documentation, which was scribed in my presence. The recorded information has been reviewed and is accurate.      Mervin Kung, MD 10/01/13 [phone]

## 2013-10-01 NOTE — Discharge Instructions (Signed)
Workup for abdominal pain without any significant findings no evidence of appendicitis. Urinalysis shows evidence of urinary tract infection. Take antibiotic as directed for the next 7 days. Take pain medicine as directed and take the Zofran as needed for nausea and vomiting. Followup with your doctor in the next few days if not better. CT did raise some question of adrenal abnormality recommend followup CAT scan by primary care Dr. in 6 months. Return for any newer symptoms.

## 2013-10-01 NOTE — ED Notes (Signed)
Right lower quadrant pain with nausea 

## 2013-10-04 LAB — URINE CULTURE: Colony Count: >=100000

## 2014-06-19 ENCOUNTER — Encounter (HOSPITAL_COMMUNITY): Payer: Self-pay | Admitting: Emergency Medicine

## 2014-06-19 ENCOUNTER — Emergency Department (HOSPITAL_COMMUNITY): Payer: BC Managed Care – PPO

## 2014-06-19 ENCOUNTER — Emergency Department (HOSPITAL_COMMUNITY)
Admission: EM | Admit: 2014-06-19 | Discharge: 2014-06-19 | Disposition: A | Discharge status: Home / Self Care | Payer: BC Managed Care – PPO | Attending: Emergency Medicine | Admitting: Emergency Medicine

## 2014-06-19 DIAGNOSIS — J40 Bronchitis, not specified as acute or chronic: Secondary | ICD-10-CM

## 2014-06-19 DIAGNOSIS — Z79899 Other long term (current) drug therapy: Secondary | ICD-10-CM | POA: Diagnosis not present

## 2014-06-19 DIAGNOSIS — R630 Anorexia: Secondary | ICD-10-CM | POA: Insufficient documentation

## 2014-06-19 DIAGNOSIS — Z7952 Long term (current) use of systemic steroids: Secondary | ICD-10-CM | POA: Diagnosis not present

## 2014-06-19 DIAGNOSIS — R110 Nausea: Secondary | ICD-10-CM | POA: Insufficient documentation

## 2014-06-19 DIAGNOSIS — Z8719 Personal history of other diseases of the digestive system: Secondary | ICD-10-CM | POA: Diagnosis not present

## 2014-06-19 DIAGNOSIS — J209 Acute bronchitis, unspecified: Secondary | ICD-10-CM | POA: Diagnosis not present

## 2014-06-19 DIAGNOSIS — Z8701 Personal history of pneumonia (recurrent): Secondary | ICD-10-CM | POA: Insufficient documentation

## 2014-06-19 DIAGNOSIS — Z720 Tobacco use: Secondary | ICD-10-CM | POA: Diagnosis not present

## 2014-06-19 DIAGNOSIS — G43909 Migraine, unspecified, not intractable, without status migrainosus: Secondary | ICD-10-CM | POA: Insufficient documentation

## 2014-06-19 DIAGNOSIS — Z880 Allergy status to penicillin: Secondary | ICD-10-CM | POA: Diagnosis not present

## 2014-06-19 DIAGNOSIS — R05 Cough: Secondary | ICD-10-CM | POA: Diagnosis present

## 2014-06-19 DIAGNOSIS — R059 Cough, unspecified: Secondary | ICD-10-CM

## 2014-06-19 MED ORDER — AZITHROMYCIN 250 MG PO TABS: ORAL_TABLET | ORAL | Status: DC

## 2014-06-19 MED ORDER — HYDROCODONE-ACETAMINOPHEN 5-325 MG PO TABS
1.0000 | ORAL_TABLET | Freq: Once | ORAL | Status: AC
  Administered 2014-06-19: 1 via ORAL
  Filled 2014-06-19: qty 1

## 2014-06-19 MED ORDER — PREDNISONE 10 MG PO TABS: 20.0000 mg | ORAL_TABLET | Freq: Two times a day (BID) | ORAL | Status: DC

## 2014-06-19 MED ORDER — IPRATROPIUM-ALBUTEROL 0.5-2.5 (3) MG/3ML IN SOLN
3.0000 mL | Freq: Once | RESPIRATORY_TRACT | Status: AC
  Administered 2014-06-19: 3 mL via RESPIRATORY_TRACT
  Filled 2014-06-19: qty 3

## 2014-06-19 MED ORDER — ALBUTEROL SULFATE (2.5 MG/3ML) 0.083% IN NEBU
5.0000 mg | INHALATION_SOLUTION | Freq: Once | RESPIRATORY_TRACT | Status: AC
  Administered 2014-06-19: 5 mg via RESPIRATORY_TRACT
  Filled 2014-06-19: qty 6

## 2014-06-19 MED ORDER — PREDNISONE 50 MG PO TABS
60.0000 mg | ORAL_TABLET | Freq: Once | ORAL | Status: AC
  Administered 2014-06-19: 60 mg via ORAL
  Filled 2014-06-19 (×2): qty 1

## 2014-06-19 MED ORDER — AZITHROMYCIN 250 MG PO TABS
500.0000 mg | ORAL_TABLET | Freq: Once | ORAL | Status: AC
  Administered 2014-06-19: 500 mg via ORAL
  Filled 2014-06-19: qty 2

## 2014-06-19 MED ORDER — HYDROCOD POLST-CHLORPHEN POLST 10-8 MG/5ML PO LQCR: 5.0000 mL | Freq: Two times a day (BID) | ORAL | Status: DC

## 2014-06-19 NOTE — ED Notes (Addendum)
Cough nonproductive with fever for 6 days.  Chest is sore.  Sore throat.  Hurts to take a deep breath

## 2014-06-19 NOTE — ED Provider Notes (Signed)
CSN: [id number]     Arrival date & time 06/19/14  1801 History  This chart was scribed for non-physician practitioner Debroah Baller, NP, working with Johnna Acosta, MD by Zola Button, ED Scribe. This patient was seen in room APFT23/APFT23 and the patient's care was started at 7:40 PM.      Chief Complaint  Patient presents with  . Cough     Patient is a 48 y.o. female presenting with cough. The history is provided by the patient. No language interpreter was used.  Cough Cough characteristics:  Non-productive Onset quality:  Gradual Duration:  6 days Timing:  Intermittent Progression:  Unchanged Chronicity:  New Smoker: yes   Relieved by:  None tried Worsened by:  Nothing tried Ineffective treatments:  None tried Associated symptoms: chills, fever, headaches, rhinorrhea, sore throat and wheezing   Associated symptoms: no chest pain, no ear pain and no rash    HPI Comments: Danielle Montoya is a 48 y.o. female with a hx of bronchitis who presents to the Emergency Department complaining of gradual onset URI symptoms that began 6 days ago. Patient reports having associated cough, sore throat, subjective fever, chills, myalgias, nausea, loss of appetite, HA and wheezing. She denies vomiting, abdominal pain, and diarrhea. Patient denies sick contacts and recent travel. She does note having PNA in the past. Patient had a surgery on her left arm 3 months ago and saw her doctor for a follow-up last week. She is a 1 ppd smoker. Patient denies any medical problems.   Past Medical History  Diagnosis Date  . Torn muscle     Left arm  . GERD (gastroesophageal reflux disease)   . Seasonal allergies   . Headache(784.0)     Migraines   Past Surgical History  Procedure Laterality Date  . Cholecystectomy      dysfunctional  . Tonsillectomy    . Abdominal hysterectomy    . Colonoscopy  06/10/2012    Procedure: COLONOSCOPY;  Surgeon: Rogene Houston, MD;  Location: [facility];  Service:  Endoscopy;  Laterality: N/A;  1200  . Arm surgery     Family History  Problem Relation Age of Onset  . Colon cancer Mother    History  Substance Use Topics  . Smoking status: Current Every Day Smoker -- 0.50 packs/day for 32 years    Types: Cigarettes  . Smokeless tobacco: Never Used     Comment: 1/2 pack a day. Smoking since age 17.  Marland Kitchen Alcohol Use: No   OB History   Grav Para Term Preterm Abortions TAB SAB Ect Mult Living                 Review of Systems  Constitutional: Positive for fever, chills and appetite change.  HENT: Positive for congestion, rhinorrhea, sinus pressure and sore throat. Negative for dental problem, ear discharge, ear pain, facial swelling and trouble swallowing.   Eyes: Negative for visual disturbance.  Respiratory: Positive for cough and wheezing. Chest tightness: with cough.   Cardiovascular: Negative for chest pain and leg swelling.  Gastrointestinal: Positive for nausea. Negative for vomiting, abdominal pain, diarrhea and constipation.  Genitourinary: Negative for dysuria, frequency and hematuria.  Musculoskeletal: Negative for back pain, neck pain and neck stiffness.  Skin: Negative for rash.  Neurological: Positive for headaches. Negative for syncope and light-headedness.  Hematological: Negative for adenopathy.  Psychiatric/Behavioral: Negative for confusion.      Allergies  Penicillins; Shellfish allergy; Aspirin; and Erythromycin  Home Medications  Prior to Admission medications   Medication Sig Start Date End Date Taking? Authorizing Provider  butalbital-acetaminophen-caffeine (FIORICET, ESGIC) 50-325-40 MG per tablet Take 1 tablet by mouth every 6 (six) hours as needed for headache or migraine.   Yes Historical Provider, MD  fluticasone (FLONASE) 50 MCG/ACT nasal spray Place 2 sprays into the nose daily as needed for allergies.    Yes Historical Provider, MD  guaiFENesin (ROBITUSSIN) 100 MG/5ML SOLN Take 5 mLs by mouth every 4 (four)  hours as needed for cough or to loosen phlegm.   Yes Historical Provider, MD  HYDROcodone-acetaminophen (NORCO/VICODIN) 5-325 MG per tablet Take 1-2 tablets by mouth every 6 (six) hours as needed for moderate pain. 10/01/13  Yes Fredia Sorrow, MD  oxyCODONE-acetaminophen (PERCOCET/ROXICET) 5-325 MG per tablet Take 1 tablet by mouth every 4 (four) hours as needed for moderate pain or severe pain (FOR HEADACHE/MIGRAINE PAIN).   Yes Historical Provider, MD  Phenyleph-CPM-DM-Aspirin (ALKA-SELTZER PLUS COLD & COUGH) 7.03-26-09-325 MG TBEF Take 1 tablet by mouth daily as needed (FOR COLD).   Yes Historical Provider, MD  topiramate (TOPAMAX) 25 MG tablet Take 75-100 mg by mouth 2 (two) times daily. PATIENT TAKES 75MG  IN THE MORNING AND 100MG  AT BEDTIME   Yes Historical Provider, MD   BP 134/87  Pulse 80  Temp(Src) 98.5 F (36.9 C) (Oral)  Resp 20  Ht 5\' 2"  (1.575 m)  Wt 198 lb (89.812 kg)  BMI 36.21 kg/m2  SpO2 95% Physical Exam  Nursing note and vitals reviewed. Constitutional: She is oriented to person, place, and time. She appears well-developed and well-nourished. No distress.  HENT:  Head: Normocephalic and atraumatic.  Mouth/Throat: Oropharynx is clear and moist. No oropharyngeal exudate.  Eyes: Conjunctivae are normal. Pupils are equal, round, and reactive to light.  Neck: Normal range of motion. Neck supple.  Cardiovascular: Normal rate.   Pulmonary/Chest: Effort normal. She has wheezes.  Decreased breath sounds. Prolonged expiration. Wheezing with inspiration and expiration bilateral lungs.  Abdominal: Soft. There is no tenderness.  Musculoskeletal: Normal range of motion. She exhibits no edema.  Lymphadenopathy:    She has no cervical adenopathy.  Neurological: She is alert and oriented to person, place, and time. No cranial nerve deficit.  Skin: Skin is warm and dry. No rash noted.  Psychiatric: She has a normal mood and affect. Her behavior is normal.    ED Course  Procedures   I discussed this case with Dr. Sabra Heck. Neb treatments here in the ED. Albuterol for home (patient states she has) Z-Pak, prednisone and cough medications.  DIAGNOSTIC STUDIES: Oxygen Saturation is 95% on RA, adequate by my interpretation.    COORDINATION OF CARE: 7:44 PM-Discussed treatment plan which includes breathing treatment and prednisone with pt at bedside and pt agreed to plan.   Labs Review Labs Reviewed - No data to display  Imaging Review Dg Chest 2 View  06/19/2014   CLINICAL DATA:  48 year old female with nonproductive cough and fever for the past 6 days. Sore throat. Pain during deep inspiration.  EXAM: CHEST  2 VIEW  COMPARISON:  Chest x-ray 07/02/2009.  FINDINGS: Lung volumes are normal. Mild diffuse peribronchial cuffing. No consolidative airspace disease. No pleural effusions. No pneumothorax. No pulmonary nodule or mass noted. Pulmonary vasculature and the cardiomediastinal silhouette are within normal limits.  IMPRESSION: 1. Mild diffuse peribronchial cuffing may suggest mild bronchitis.   Electronically Signed   By: Vinnie Langton M.D.   On: 06/19/2014 19:31    Patient re examined  after Duo Neb. Air movement has improved but she continues to have inspiratory and expiratory wheezing.  MDM  48 y.o. female with cough and wheezing that has worsened over the past few days. Stable for discharge without respiratory distress. I have reviewed this patient's vital signs, nurses notes, appropriate labs and imaging. She voices understanding and agrees with plan. She will follow up with her PCP. She will return here for worsening symptoms.    Medication List    TAKE these medications       azithromycin 250 MG tablet  Commonly known as:  ZITHROMAX  Starting 06/20/14 take one tablet PO daily     chlorpheniramine-HYDROcodone 10-8 MG/5ML Lqcr  Commonly known as:  TUSSIONEX PENNKINETIC ER  Take 5 mLs by mouth 2 (two) times daily.     predniSONE 10 MG tablet  Commonly known  as:  DELTASONE  Take 2 tablets (20 mg total) by mouth 2 (two) times daily with a meal.      ASK your doctor about these medications       ALKA-SELTZER PLUS COLD & COUGH 7.03-26-09-325 MG Tbef  Generic drug:  Phenyleph-CPM-DM-Aspirin  Take 1 tablet by mouth daily as needed (FOR COLD).     butalbital-acetaminophen-caffeine 50-325-40 MG per tablet  Commonly known as:  FIORICET, ESGIC  Take 1 tablet by mouth every 6 (six) hours as needed for headache or migraine.     fluticasone 50 MCG/ACT nasal spray  Commonly known as:  FLONASE  Place 2 sprays into the nose daily as needed for allergies.     guaiFENesin 100 MG/5ML Soln  Commonly known as:  ROBITUSSIN  Take 5 mLs by mouth every 4 (four) hours as needed for cough or to loosen phlegm.     HYDROcodone-acetaminophen 5-325 MG per tablet  Commonly known as:  NORCO/VICODIN  Take 1-2 tablets by mouth every 6 (six) hours as needed for moderate pain.     oxyCODONE-acetaminophen 5-325 MG per tablet  Commonly known as:  PERCOCET/ROXICET  Take 1 tablet by mouth every 4 (four) hours as needed for moderate pain or severe pain (FOR HEADACHE/MIGRAINE PAIN).     topiramate 25 MG tablet  Commonly known as:  TOPAMAX  Take 75-100 mg by mouth 2 (two) times daily. PATIENT TAKES 75MG  IN THE MORNING AND 100MG  AT BEDTIME       I personally performed the services described in this documentation, which was scribed in my presence. The recorded information has been reviewed and is accurate.    [facility] Bunnie Pion, [place] 06/20/14 [phone]

## 2014-06-20 NOTE — ED Provider Notes (Signed)
Medical screening examination/treatment/procedure(s) were performed by non-physician practitioner and as supervising physician I was immediately available for consultation/collaboration.    Johnna Acosta, MD 06/20/14 [phone]

## 2014-06-26 ENCOUNTER — Emergency Department (HOSPITAL_COMMUNITY)
Admission: EM | Admit: 2014-06-26 | Discharge: 2014-06-26 | Disposition: A | Discharge status: Home / Self Care | Payer: BC Managed Care – PPO | Attending: Emergency Medicine | Admitting: Emergency Medicine

## 2014-06-26 ENCOUNTER — Encounter (HOSPITAL_COMMUNITY): Payer: Self-pay | Admitting: *Deleted

## 2014-06-26 ENCOUNTER — Emergency Department (HOSPITAL_COMMUNITY): Payer: BC Managed Care – PPO

## 2014-06-26 DIAGNOSIS — Z8719 Personal history of other diseases of the digestive system: Secondary | ICD-10-CM | POA: Insufficient documentation

## 2014-06-26 DIAGNOSIS — Z792 Long term (current) use of antibiotics: Secondary | ICD-10-CM | POA: Insufficient documentation

## 2014-06-26 DIAGNOSIS — Z720 Tobacco use: Secondary | ICD-10-CM | POA: Insufficient documentation

## 2014-06-26 DIAGNOSIS — Z79899 Other long term (current) drug therapy: Secondary | ICD-10-CM | POA: Diagnosis not present

## 2014-06-26 DIAGNOSIS — Z880 Allergy status to penicillin: Secondary | ICD-10-CM | POA: Insufficient documentation

## 2014-06-26 DIAGNOSIS — J209 Acute bronchitis, unspecified: Secondary | ICD-10-CM | POA: Insufficient documentation

## 2014-06-26 DIAGNOSIS — R059 Cough, unspecified: Secondary | ICD-10-CM

## 2014-06-26 DIAGNOSIS — Z7951 Long term (current) use of inhaled steroids: Secondary | ICD-10-CM | POA: Diagnosis not present

## 2014-06-26 DIAGNOSIS — G43909 Migraine, unspecified, not intractable, without status migrainosus: Secondary | ICD-10-CM | POA: Diagnosis not present

## 2014-06-26 DIAGNOSIS — J40 Bronchitis, not specified as acute or chronic: Secondary | ICD-10-CM

## 2014-06-26 DIAGNOSIS — Z87828 Personal history of other (healed) physical injury and trauma: Secondary | ICD-10-CM | POA: Diagnosis not present

## 2014-06-26 DIAGNOSIS — R05 Cough: Secondary | ICD-10-CM

## 2014-06-26 DIAGNOSIS — Z7952 Long term (current) use of systemic steroids: Secondary | ICD-10-CM | POA: Insufficient documentation

## 2014-06-26 LAB — CBC WITH DIFFERENTIAL/PLATELET
BASOS ABS: 0 10*3/uL (ref 0.0–0.1)
Basophils Relative: 0 % (ref 0–1)
Eosinophils Absolute: 0.3 10*3/uL (ref 0.0–0.7)
Eosinophils Relative: 3 % (ref 0–5)
HCT: 40.9 % (ref 36.0–46.0)
Hemoglobin: 13.5 g/dL (ref 12.0–15.0)
Lymphocytes Relative: 50 % — ABNORMAL HIGH (ref 12–46)
Lymphs Abs: 4.6 10*3/uL — ABNORMAL HIGH (ref 0.7–4.0)
MCH: 31.1 pg (ref 26.0–34.0)
MCHC: 33 g/dL (ref 30.0–36.0)
MCV: 94.2 fL (ref 78.0–100.0)
Monocytes Absolute: 0.6 10*3/uL (ref 0.1–1.0)
Monocytes Relative: 7 % (ref 3–12)
NEUTROS ABS: 3.7 10*3/uL (ref 1.7–7.7)
NEUTROS PCT: 40 % — AB (ref 43–77)
Platelets: 200 10*3/uL (ref 150–400)
RBC: 4.34 MIL/uL (ref 3.87–5.11)
RDW: 13.1 % (ref 11.5–15.5)
WBC: 9.2 10*3/uL (ref 4.0–10.5)

## 2014-06-26 LAB — BASIC METABOLIC PANEL
ANION GAP: 13 (ref 5–15)
BUN: 7 mg/dL (ref 6–23)
CALCIUM: 9.4 mg/dL (ref 8.4–10.5)
CO2: 26 meq/L (ref 19–32)
Chloride: 102 mEq/L (ref 96–112)
Creatinine, Ser: 0.65 mg/dL (ref 0.50–1.10)
GFR calc Af Amer: >90 mL/min (ref >90)
GFR calc non Af Amer: >90 mL/min (ref >90)
Glucose, Bld: 112 mg/dL — ABNORMAL HIGH (ref 70–99)
POTASSIUM: 3.7 meq/L (ref 3.7–5.3)
SODIUM: 141 meq/L (ref 137–147)

## 2014-06-26 MED ORDER — OXYCODONE-ACETAMINOPHEN 5-325 MG PO TABS: 1.0000 | ORAL_TABLET | Freq: Four times a day (QID) | ORAL | Status: DC | PRN

## 2014-06-26 MED ORDER — HYDROMORPHONE HCL 1 MG/ML IJ SOLN
1.0000 mg | Freq: Once | INTRAMUSCULAR | Status: AC
  Administered 2014-06-26: 1 mg via INTRAVENOUS
  Filled 2014-06-26: qty 1

## 2014-06-26 MED ORDER — METHYLPREDNISOLONE SODIUM SUCC 125 MG IJ SOLR
125.0000 mg | Freq: Once | INTRAMUSCULAR | Status: AC
  Administered 2014-06-26: 125 mg via INTRAVENOUS
  Filled 2014-06-26: qty 2

## 2014-06-26 MED ORDER — DOXYCYCLINE HYCLATE 100 MG PO CAPS: 100.0000 mg | ORAL_CAPSULE | Freq: Two times a day (BID) | ORAL | Status: DC

## 2014-06-26 MED ORDER — HYDROMORPHONE HCL 1 MG/ML IJ SOLN
0.5000 mg | Freq: Once | INTRAMUSCULAR | Status: AC
  Administered 2014-06-26: 0.5 mg via INTRAVENOUS
  Filled 2014-06-26: qty 1

## 2014-06-26 MED ORDER — PREDNISONE 10 MG PO TABS: 20.0000 mg | ORAL_TABLET | Freq: Every day | ORAL | Status: DC

## 2014-06-26 MED ORDER — IPRATROPIUM-ALBUTEROL 0.5-2.5 (3) MG/3ML IN SOLN
3.0000 mL | Freq: Once | RESPIRATORY_TRACT | Status: AC
  Administered 2014-06-26: 3 mL via RESPIRATORY_TRACT
  Filled 2014-06-26: qty 3

## 2014-06-26 MED ORDER — ALBUTEROL SULFATE (2.5 MG/3ML) 0.083% IN NEBU
2.5000 mg | INHALATION_SOLUTION | Freq: Once | RESPIRATORY_TRACT | Status: AC
  Administered 2014-06-26: 2.5 mg via RESPIRATORY_TRACT
  Filled 2014-06-26: qty 3

## 2014-06-26 MED ORDER — DOXYCYCLINE HYCLATE 100 MG PO TABS
100.0000 mg | ORAL_TABLET | Freq: Once | ORAL | Status: AC
  Administered 2014-06-26: 100 mg via ORAL
  Filled 2014-06-26: qty 1

## 2014-06-26 NOTE — ED Provider Notes (Signed)
CSN: [id number]     Arrival date & time 06/26/14  1746 History  This chart was scribed for Maudry Diego, MD by Dellis Filbert, ED Scribe. The patient was seen in [facility] and the patient's care was started at 9:04 PM.    Chief Complaint  Patient presents with  . Cough   Patient is a 48 y.o. female presenting with cough. The history is provided by the patient. No language interpreter was used.  Cough Severity:  Mild Onset quality:  Sudden Duration:  3 weeks Timing:  Constant Chronicity:  Recurrent Smoker: yes   Relieved by:  Nothing Worsened by:  Nothing tried Associated symptoms: no chest pain, no eye discharge, no headaches and no rash    HPI Comments: Danielle Montoya is a 48 y.o. female who presents to the Emergency Department complaining of wheezing and cough for 3 weeks. Pt was here last Thursday and seen by Dr. Gerarda Fraction and given medication which provided no relief. Pt does smoke.  Past Medical History  Diagnosis Date  . Torn muscle     Left arm  . GERD (gastroesophageal reflux disease)   . Seasonal allergies   . Headache(784.0)     Migraines   Past Surgical History  Procedure Laterality Date  . Cholecystectomy      dysfunctional  . Tonsillectomy    . Abdominal hysterectomy    . Colonoscopy  06/10/2012    Procedure: COLONOSCOPY;  Surgeon: Rogene Houston, MD;  Location: [facility];  Service: Endoscopy;  Laterality: N/A;  1200  . Arm surgery     Family History  Problem Relation Age of Onset  . Colon cancer Mother    History  Substance Use Topics  . Smoking status: Current Every Day Smoker -- 0.50 packs/day for 32 years    Types: Cigarettes  . Smokeless tobacco: Never Used     Comment: 1/2 pack a day. Smoking since age 33.  Marland Kitchen Alcohol Use: No   OB History    No data available     Review of Systems  Constitutional: Negative for appetite change and fatigue.  HENT: Negative for congestion, ear discharge and sinus pressure.   Eyes: Negative for  discharge.  Respiratory: Positive for cough.   Cardiovascular: Negative for chest pain.  Gastrointestinal: Negative for abdominal pain and diarrhea.  Genitourinary: Negative for frequency and hematuria.  Musculoskeletal: Negative for back pain.  Skin: Negative for rash.  Neurological: Negative for seizures and headaches.  Psychiatric/Behavioral: Negative for hallucinations.      Allergies  Penicillins; Shellfish allergy; Aspirin; and Erythromycin  Home Medications   Prior to Admission medications   Medication Sig Start Date End Date Taking? Authorizing Provider  azithromycin (ZITHROMAX) 250 MG tablet Starting 06/20/14 take one tablet PO daily 06/19/14   [facility], NP  butalbital-acetaminophen-caffeine (FIORICET, ESGIC) 50-325-40 MG per tablet Take 1 tablet by mouth every 6 (six) hours as needed for headache or migraine.    Historical Provider, MD  chlorpheniramine-HYDROcodone (TUSSIONEX PENNKINETIC ER) 10-8 MG/5ML LQCR Take 5 mLs by mouth 2 (two) times daily. 06/19/14   Hope Bunnie Pion, NP  fluticasone (FLONASE) 50 MCG/ACT nasal spray Place 2 sprays into the nose daily as needed for allergies.     Historical Provider, MD  guaiFENesin (ROBITUSSIN) 100 MG/5ML SOLN Take 5 mLs by mouth every 4 (four) hours as needed for cough or to loosen phlegm.    Historical Provider, MD  HYDROcodone-acetaminophen (NORCO/VICODIN) 5-325 MG per tablet Take 1-2  tablets by mouth every 6 (six) hours as needed for moderate pain. 10/01/13   Fredia Sorrow, MD  oxyCODONE-acetaminophen (PERCOCET/ROXICET) 5-325 MG per tablet Take 1 tablet by mouth every 4 (four) hours as needed for moderate pain or severe pain (FOR HEADACHE/MIGRAINE PAIN).    Historical Provider, MD  Phenyleph-CPM-DM-Aspirin (ALKA-SELTZER PLUS COLD & COUGH) 7.03-26-09-325 MG TBEF Take 1 tablet by mouth daily as needed (FOR COLD).    Historical Provider, MD  predniSONE (DELTASONE) 10 MG tablet Take 2 tablets (20 mg total) by mouth 2 (two) times daily  with a meal. 06/19/14   Hope Bunnie Pion, NP  topiramate (TOPAMAX) 25 MG tablet Take 75-100 mg by mouth 2 (two) times daily. PATIENT TAKES 75MG  IN THE MORNING AND 100MG  AT BEDTIME    Historical Provider, MD   BP 133/80 mmHg  Pulse 81  Temp(Src) 98.2 F (36.8 C) (Oral)  Resp 18  Ht 5\' 2"  (1.575 m)  Wt 192 lb (87.091 kg)  BMI 35.11 kg/m2  SpO2 97% Physical Exam  Constitutional: She is oriented to person, place, and time. She appears well-developed.  HENT:  Head: Normocephalic.  Eyes: Conjunctivae and EOM are normal. No scleral icterus.  Neck: Neck supple. No thyromegaly present.  Cardiovascular: Normal rate and regular rhythm.  Exam reveals no gallop and no friction rub.   No murmur heard. Pulmonary/Chest: No stridor. She has no wheezes. She has no rales. She exhibits no tenderness.  Moderate wheezing bilaterally.  Abdominal: She exhibits no distension. There is no tenderness. There is no rebound.  Musculoskeletal: Normal range of motion. She exhibits no edema.  Lymphadenopathy:    She has no cervical adenopathy.  Neurological: She is oriented to person, place, and time. She exhibits normal muscle tone. Coordination normal.  Skin: No rash noted. No erythema.  Psychiatric: She has a normal mood and affect. Her behavior is normal.    ED Course  Procedures  DIAGNOSTIC STUDIES: Oxygen Saturation is 97% on room air, adequate by my interpretation.    COORDINATION OF CARE: 9:09 PM Discussed treatment plan with pt at bedside and pt agreed to plan.   Labs Review Labs Reviewed  CBC WITH DIFFERENTIAL  BASIC METABOLIC PANEL    Imaging Review Dg Chest 2 View  06/26/2014   CLINICAL DATA:  Cough 4 12 days. Patient seen previously on 06/19/2014 and by local physician on 06/23/2014, but has not improved. Also complaining of chest discomfort.  EXAM: CHEST  2 VIEW  COMPARISON:  06/19/2014.  FINDINGS: Heart, mediastinum hila are unremarkable. Lung volumes are relatively low. Mild peribronchial  interstitial thickening suggests on the prior study is unchanged. There are no areas of lung consolidation. No evidence of edema. No pleural effusion or pneumothorax.  Bony thorax is unremarkable other than mild mid to lower thoracic spine disc degenerative change.  IMPRESSION: 1. No evidence of pneumonia. 2. Mild peribronchial interstitial thickening noted on the prior study is unchanged. This may reflect persistent bronchitis.   Electronically Signed   By: Lajean Manes M.D.   On: 06/26/2014 18:30     EKG Interpretation None      MDM   Final diagnoses:  Cough     The chart was scribed for me under my direct supervision.  I personally performed the history, physical, and medical decision making and all procedures in the evaluation of this patient.Maudry Diego, MD 06/26/14 [phone]

## 2014-06-26 NOTE — ED Notes (Signed)
Family at bedside. Patient states that she is having pain but can not describe it or one specific place. Patient can barely keep eyes open .

## 2014-06-26 NOTE — ED Notes (Signed)
Cough since 10/20, seen here 10/26 and by Dr Gerarda Fraction on 10/30.  Says she is no better. NP cough,  Chest hurts.

## 2014-06-26 NOTE — Discharge Instructions (Signed)
Follow up with your md in 2-3 days °

## 2015-10-05 ENCOUNTER — Other Ambulatory Visit (HOSPITAL_COMMUNITY): Payer: Self-pay | Admitting: Physician Assistant

## 2015-10-05 ENCOUNTER — Ambulatory Visit (HOSPITAL_COMMUNITY)
Admission: RE | Admit: 2015-10-05 | Discharge: 2015-10-05 | Disposition: A | Discharge status: Home / Self Care | Payer: BC Managed Care – PPO | Source: Ambulatory Visit | Attending: Physician Assistant | Admitting: Physician Assistant

## 2015-10-05 DIAGNOSIS — Z6834 Body mass index (BMI) 34.0-34.9, adult: Secondary | ICD-10-CM | POA: Insufficient documentation

## 2015-10-05 DIAGNOSIS — J208 Acute bronchitis due to other specified organisms: Secondary | ICD-10-CM

## 2015-10-05 DIAGNOSIS — G43109 Migraine with aura, not intractable, without status migrainosus: Secondary | ICD-10-CM | POA: Insufficient documentation

## 2015-10-05 DIAGNOSIS — Z1389 Encounter for screening for other disorder: Secondary | ICD-10-CM | POA: Diagnosis not present

## 2015-10-24 ENCOUNTER — Other Ambulatory Visit (HOSPITAL_COMMUNITY): Payer: Self-pay | Admitting: Internal Medicine

## 2015-10-24 ENCOUNTER — Ambulatory Visit (HOSPITAL_COMMUNITY)
Admission: RE | Admit: 2015-10-24 | Discharge: 2015-10-24 | Disposition: A | Discharge status: Home / Self Care | Payer: BC Managed Care – PPO | Source: Ambulatory Visit | Attending: Internal Medicine | Admitting: Internal Medicine

## 2015-10-24 DIAGNOSIS — R059 Cough, unspecified: Secondary | ICD-10-CM

## 2015-10-24 DIAGNOSIS — R05 Cough: Secondary | ICD-10-CM

## 2015-10-24 DIAGNOSIS — Z1231 Encounter for screening mammogram for malignant neoplasm of breast: Secondary | ICD-10-CM

## 2015-10-26 ENCOUNTER — Ambulatory Visit (HOSPITAL_COMMUNITY)
Admission: RE | Admit: 2015-10-26 | Discharge: 2015-10-26 | Disposition: A | Discharge status: Home / Self Care | Payer: BC Managed Care – PPO | Source: Ambulatory Visit | Attending: Internal Medicine | Admitting: Internal Medicine

## 2015-10-26 DIAGNOSIS — Z1231 Encounter for screening mammogram for malignant neoplasm of breast: Secondary | ICD-10-CM | POA: Insufficient documentation

## 2016-02-28 ENCOUNTER — Ambulatory Visit (INDEPENDENT_AMBULATORY_CARE_PROVIDER_SITE_OTHER): Payer: BC Managed Care – PPO | Admitting: Otolaryngology

## 2016-04-16 ENCOUNTER — Ambulatory Visit (HOSPITAL_COMMUNITY)
Admission: RE | Admit: 2016-04-16 | Discharge: 2016-04-16 | Disposition: A | Discharge status: Home / Self Care | Payer: BLUE CROSS/BLUE SHIELD | Source: Ambulatory Visit | Attending: Registered Nurse | Admitting: Registered Nurse

## 2016-04-16 ENCOUNTER — Other Ambulatory Visit (HOSPITAL_COMMUNITY): Payer: Self-pay | Admitting: Registered Nurse

## 2016-04-16 DIAGNOSIS — M79604 Pain in right leg: Secondary | ICD-10-CM | POA: Insufficient documentation

## 2016-07-16 ENCOUNTER — Other Ambulatory Visit (HOSPITAL_COMMUNITY): Payer: Self-pay | Admitting: Internal Medicine

## 2016-07-16 ENCOUNTER — Ambulatory Visit (HOSPITAL_COMMUNITY)
Admission: RE | Admit: 2016-07-16 | Discharge: 2016-07-16 | Disposition: A | Discharge status: Home / Self Care | Payer: BLUE CROSS/BLUE SHIELD | Source: Ambulatory Visit | Attending: Internal Medicine | Admitting: Internal Medicine

## 2016-07-16 DIAGNOSIS — R05 Cough: Secondary | ICD-10-CM

## 2016-07-16 DIAGNOSIS — Z1389 Encounter for screening for other disorder: Secondary | ICD-10-CM | POA: Diagnosis not present

## 2016-07-16 DIAGNOSIS — R059 Cough, unspecified: Secondary | ICD-10-CM

## 2016-08-07 ENCOUNTER — Other Ambulatory Visit (HOSPITAL_COMMUNITY): Payer: Self-pay | Admitting: Internal Medicine

## 2016-08-07 ENCOUNTER — Ambulatory Visit (HOSPITAL_COMMUNITY)
Admission: RE | Admit: 2016-08-07 | Discharge: 2016-08-07 | Disposition: A | Discharge status: Home / Self Care | Payer: BLUE CROSS/BLUE SHIELD | Source: Ambulatory Visit | Attending: Internal Medicine | Admitting: Internal Medicine

## 2016-08-07 DIAGNOSIS — I700 Atherosclerosis of aorta: Secondary | ICD-10-CM | POA: Insufficient documentation

## 2016-08-07 DIAGNOSIS — W19XXXA Unspecified fall, initial encounter: Secondary | ICD-10-CM

## 2016-08-07 DIAGNOSIS — X58XXXA Exposure to other specified factors, initial encounter: Secondary | ICD-10-CM | POA: Insufficient documentation

## 2016-08-07 DIAGNOSIS — S3992XA Unspecified injury of lower back, initial encounter: Secondary | ICD-10-CM | POA: Diagnosis present

## 2016-08-28 ENCOUNTER — Ambulatory Visit (INDEPENDENT_AMBULATORY_CARE_PROVIDER_SITE_OTHER): Payer: BLUE CROSS/BLUE SHIELD | Admitting: Otolaryngology

## 2016-08-28 DIAGNOSIS — R05 Cough: Secondary | ICD-10-CM | POA: Diagnosis not present

## 2016-08-28 DIAGNOSIS — H6123 Impacted cerumen, bilateral: Secondary | ICD-10-CM | POA: Diagnosis not present

## 2016-08-28 DIAGNOSIS — F17210 Nicotine dependence, cigarettes, uncomplicated: Secondary | ICD-10-CM

## 2016-08-28 DIAGNOSIS — J381 Polyp of vocal cord and larynx: Secondary | ICD-10-CM | POA: Diagnosis not present

## 2016-08-28 DIAGNOSIS — K219 Gastro-esophageal reflux disease without esophagitis: Secondary | ICD-10-CM

## 2016-09-04 ENCOUNTER — Other Ambulatory Visit: Payer: Self-pay | Admitting: Otolaryngology

## 2016-09-10 ENCOUNTER — Encounter (HOSPITAL_BASED_OUTPATIENT_CLINIC_OR_DEPARTMENT_OTHER): Payer: Self-pay | Admitting: *Deleted

## 2016-09-15 ENCOUNTER — Ambulatory Visit (HOSPITAL_BASED_OUTPATIENT_CLINIC_OR_DEPARTMENT_OTHER): Payer: BLUE CROSS/BLUE SHIELD | Admitting: Anesthesiology

## 2016-09-15 ENCOUNTER — Encounter (HOSPITAL_BASED_OUTPATIENT_CLINIC_OR_DEPARTMENT_OTHER): Payer: Self-pay

## 2016-09-15 ENCOUNTER — Ambulatory Visit (HOSPITAL_BASED_OUTPATIENT_CLINIC_OR_DEPARTMENT_OTHER)
Admission: RE | Admit: 2016-09-15 | Discharge: 2016-09-15 | Disposition: A | Discharge status: Home / Self Care | Payer: BLUE CROSS/BLUE SHIELD | Source: Ambulatory Visit | Attending: Otolaryngology | Admitting: Otolaryngology

## 2016-09-15 ENCOUNTER — Encounter (HOSPITAL_BASED_OUTPATIENT_CLINIC_OR_DEPARTMENT_OTHER)
Admission: RE | Disposition: A | Discharge status: Home / Self Care | Payer: Self-pay | Source: Ambulatory Visit | Attending: Otolaryngology

## 2016-09-15 DIAGNOSIS — H6123 Impacted cerumen, bilateral: Secondary | ICD-10-CM | POA: Diagnosis not present

## 2016-09-15 DIAGNOSIS — K219 Gastro-esophageal reflux disease without esophagitis: Secondary | ICD-10-CM | POA: Diagnosis not present

## 2016-09-15 DIAGNOSIS — F17210 Nicotine dependence, cigarettes, uncomplicated: Secondary | ICD-10-CM | POA: Insufficient documentation

## 2016-09-15 DIAGNOSIS — J381 Polyp of vocal cord and larynx: Secondary | ICD-10-CM | POA: Diagnosis present

## 2016-09-15 DIAGNOSIS — D380 Neoplasm of uncertain behavior of larynx: Secondary | ICD-10-CM | POA: Diagnosis not present

## 2016-09-15 HISTORY — DX: Anxiety disorder, unspecified: F41.9

## 2016-09-15 HISTORY — DX: Unspecified osteoarthritis, unspecified site: M19.90

## 2016-09-15 HISTORY — DX: Major depressive disorder, single episode, unspecified: F32.9

## 2016-09-15 HISTORY — DX: Depression, unspecified: F32.A

## 2016-09-15 HISTORY — PX: MICROLARYNGOSCOPY: SHX5208

## 2016-09-15 SURGERY — MICROLARYNGOSCOPY
Anesthesia: General | Site: Throat | Laterality: Right

## 2016-09-15 MED ORDER — ONDANSETRON HCL 4 MG/2ML IJ SOLN
INTRAMUSCULAR | Status: AC
  Filled 2016-09-15: qty 2

## 2016-09-15 MED ORDER — SCOPOLAMINE 1 MG/3DAYS TD PT72: 1.0000 | MEDICATED_PATCH | Freq: Once | TRANSDERMAL | Status: DC | PRN

## 2016-09-15 MED ORDER — OXYCODONE HCL 5 MG/5ML PO SOLN: 5.0000 mg | Freq: Once | ORAL | Status: DC | PRN

## 2016-09-15 MED ORDER — LIDOCAINE 2% (20 MG/ML) 5 ML SYRINGE
INTRAMUSCULAR | Status: AC
  Filled 2016-09-15: qty 5

## 2016-09-15 MED ORDER — PROMETHAZINE HCL 25 MG/ML IJ SOLN: 6.2500 mg | INTRAMUSCULAR | Status: DC | PRN

## 2016-09-15 MED ORDER — MEPERIDINE HCL 25 MG/ML IJ SOLN: 6.2500 mg | INTRAMUSCULAR | Status: DC | PRN

## 2016-09-15 MED ORDER — EPINEPHRINE PF 1 MG/ML IJ SOLN
INTRAMUSCULAR | Status: DC | PRN
  Administered 2016-09-15: 1 mg

## 2016-09-15 MED ORDER — LACTATED RINGERS IV SOLN
INTRAVENOUS | Status: DC
  Administered 2016-09-15 (×2): via INTRAVENOUS

## 2016-09-15 MED ORDER — HYDROMORPHONE HCL 1 MG/ML IJ SOLN
INTRAMUSCULAR | Status: AC
  Filled 2016-09-15: qty 1

## 2016-09-15 MED ORDER — SUCCINYLCHOLINE CHLORIDE 20 MG/ML IJ SOLN
INTRAMUSCULAR | Status: DC | PRN
  Administered 2016-09-15: 80 mg via INTRAVENOUS

## 2016-09-15 MED ORDER — LIDOCAINE 2% (20 MG/ML) 5 ML SYRINGE
INTRAMUSCULAR | Status: DC | PRN
  Administered 2016-09-15: 80 mg via INTRAVENOUS

## 2016-09-15 MED ORDER — ALBUTEROL SULFATE HFA 108 (90 BASE) MCG/ACT IN AERS
INHALATION_SPRAY | RESPIRATORY_TRACT | Status: DC | PRN
  Administered 2016-09-15: 2 via RESPIRATORY_TRACT
  Administered 2016-09-15: 3 via RESPIRATORY_TRACT

## 2016-09-15 MED ORDER — ROCURONIUM BROMIDE 100 MG/10ML IV SOLN
INTRAVENOUS | Status: DC | PRN
  Administered 2016-09-15: 30 mg via INTRAVENOUS

## 2016-09-15 MED ORDER — FENTANYL CITRATE (PF) 100 MCG/2ML IJ SOLN
50.0000 ug | INTRAMUSCULAR | Status: AC | PRN
  Administered 2016-09-15 (×2): 50 ug via INTRAVENOUS
  Administered 2016-09-15: 100 ug via INTRAVENOUS

## 2016-09-15 MED ORDER — OXYCODONE HCL 5 MG PO TABS: 5.0000 mg | ORAL_TABLET | Freq: Once | ORAL | Status: DC | PRN

## 2016-09-15 MED ORDER — MIDAZOLAM HCL 2 MG/2ML IJ SOLN
INTRAMUSCULAR | Status: AC
  Filled 2016-09-15: qty 2

## 2016-09-15 MED ORDER — HYDROMORPHONE HCL 1 MG/ML IJ SOLN
0.2500 mg | INTRAMUSCULAR | Status: DC | PRN
  Administered 2016-09-15: 0.5 mg via INTRAVENOUS

## 2016-09-15 MED ORDER — PROPOFOL 10 MG/ML IV BOLUS
INTRAVENOUS | Status: AC
  Filled 2016-09-15: qty 20

## 2016-09-15 MED ORDER — SUCCINYLCHOLINE CHLORIDE 200 MG/10ML IV SOSY
PREFILLED_SYRINGE | INTRAVENOUS | Status: AC
  Filled 2016-09-15: qty 10

## 2016-09-15 MED ORDER — SUGAMMADEX SODIUM 200 MG/2ML IV SOLN
INTRAVENOUS | Status: DC | PRN
  Administered 2016-09-15: 200 mg via INTRAVENOUS

## 2016-09-15 MED ORDER — LIDOCAINE HCL (CARDIAC) 20 MG/ML IV SOLN
INTRAVENOUS | Status: DC | PRN
  Administered 2016-09-15: 100 mg via INTRAVENOUS

## 2016-09-15 MED ORDER — FENTANYL CITRATE (PF) 100 MCG/2ML IJ SOLN
INTRAMUSCULAR | Status: AC
  Filled 2016-09-15: qty 2

## 2016-09-15 MED ORDER — DEXAMETHASONE SODIUM PHOSPHATE 10 MG/ML IJ SOLN
INTRAMUSCULAR | Status: AC
  Filled 2016-09-15: qty 1

## 2016-09-15 MED ORDER — ALBUTEROL SULFATE HFA 108 (90 BASE) MCG/ACT IN AERS
INHALATION_SPRAY | RESPIRATORY_TRACT | Status: AC
  Filled 2016-09-15: qty 6.7

## 2016-09-15 MED ORDER — DEXAMETHASONE SODIUM PHOSPHATE 4 MG/ML IJ SOLN
INTRAMUSCULAR | Status: DC | PRN
  Administered 2016-09-15: 10 mg via INTRAVENOUS

## 2016-09-15 MED ORDER — PROPOFOL 10 MG/ML IV BOLUS
INTRAVENOUS | Status: DC | PRN
  Administered 2016-09-15: 200 mg via INTRAVENOUS

## 2016-09-15 MED ORDER — MIDAZOLAM HCL 2 MG/2ML IJ SOLN
1.0000 mg | INTRAMUSCULAR | Status: DC | PRN
  Administered 2016-09-15: 2 mg via INTRAVENOUS

## 2016-09-15 SURGICAL SUPPLY — 19 items
CANISTER SUCT 1200ML W/VALVE (MISCELLANEOUS) ×3 IMPLANT
GLOVE BIO SURGEON STRL SZ7.5 (GLOVE) ×3 IMPLANT
GOWN STRL REUS W/ TWL LRG LVL3 (GOWN DISPOSABLE) ×1 IMPLANT
GOWN STRL REUS W/TWL LRG LVL3 (GOWN DISPOSABLE) ×2
GUARD TEETH (MISCELLANEOUS) ×3 IMPLANT
MARKER SKIN DUAL TIP RULER LAB (MISCELLANEOUS) ×3 IMPLANT
NEEDLE HYPO 18GX1.5 BLUNT FILL (NEEDLE) ×3 IMPLANT
NEEDLE SPNL 22GX7 QUINCKE BK (NEEDLE) IMPLANT
NS IRRIG 1000ML POUR BTL (IV SOLUTION) ×3 IMPLANT
PATTIES SURGICAL .5 X3 (DISPOSABLE) ×3 IMPLANT
SHEET MEDIUM DRAPE 40X70 STRL (DRAPES) ×3 IMPLANT
SLEEVE SCD COMPRESS KNEE MED (MISCELLANEOUS) ×3 IMPLANT
SOLUTION BUTLER CLEAR DIP (MISCELLANEOUS) ×3 IMPLANT
SPONGE GAUZE 4X4 12PLY STER LF (GAUZE/BANDAGES/DRESSINGS) ×3 IMPLANT
SYR CONTROL 10ML LL (SYRINGE) ×3 IMPLANT
SYR TB 1ML LL NO SAFETY (SYRINGE) IMPLANT
TOWEL OR 17X24 6PK STRL BLUE (TOWEL DISPOSABLE) ×3 IMPLANT
TUBE CONNECTING 20'X1/4 (TUBING) ×1
TUBE CONNECTING 20X1/4 (TUBING) ×2 IMPLANT

## 2016-09-15 NOTE — Anesthesia Postprocedure Evaluation (Signed)
Anesthesia Post Note  Patient: Danielle Montoya  Procedure(s) Performed: Procedure(s) (LRB): RIGHT VOCAL CORD MICRO DIRECT LARYNGOSCOPY WITH BIOPSY (Right)  Patient location during evaluation: PACU Anesthesia Type: General Level of consciousness: sedated and patient cooperative Pain management: pain level controlled Vital Signs Assessment: post-procedure vital signs reviewed and stable Respiratory status: spontaneous breathing Cardiovascular status: stable Anesthetic complications: no       Last Vitals:  Vitals:   09/15/16 1240 09/15/16 1316  BP: 107/67 120/68  Pulse: 85 79  Resp: 20 18  Temp:  36.7 C    Last Pain:  Vitals:   09/15/16 1316  TempSrc:   PainSc: 0-No pain                 Nolon Nations

## 2016-09-15 NOTE — Discharge Instructions (Addendum)
The patient may resume all her previous activities, medications, and diet. The patient will follow-up in my [place] office in approximately 1 week.    Post Anesthesia Home Care Instructions  Activity: Get plenty of rest for the remainder of the day. A responsible adult should stay with you for 24 hours following the procedure.  For the next 24 hours, DO NOT: -Drive a car -Paediatric nurse -Drink alcoholic beverages -Take any medication unless instructed by your physician -Make any legal decisions or sign important papers.  Meals: Start with liquid foods such as gelatin or soup. Progress to regular foods as tolerated. Avoid greasy, spicy, heavy foods. If nausea and/or vomiting occur, drink only clear liquids until the nausea and/or vomiting subsides. Call your physician if vomiting continues.  Special Instructions/Symptoms: Your throat may feel dry or sore from the anesthesia or the breathing tube placed in your throat during surgery. If this causes discomfort, gargle with warm salt water. The discomfort should disappear within 24 hours.  If you had a scopolamine patch placed behind your ear for the management of post- operative nausea and/or vomiting:  1. The medication in the patch is effective for 72 hours, after which it should be removed.  Wrap patch in a tissue and discard in the trash. Wash hands thoroughly with soap and water. 2. You may remove the patch earlier than 72 hours if you experience unpleasant side effects which may include dry mouth, dizziness or visual disturbances. 3. Avoid touching the patch. Wash your hands with soap and water after contact with the patch.

## 2016-09-15 NOTE — Anesthesia Preprocedure Evaluation (Addendum)
Anesthesia Evaluation  Patient identified by MRN, date of birth, ID band Patient awake    Reviewed: Allergy & Precautions, NPO status , Patient's Chart, lab work & pertinent test results  Airway Mallampati: II  TM Distance: >3 FB Neck ROM: Full    Dental no notable dental hx.    Pulmonary Current Smoker,    Pulmonary exam normal breath sounds clear to auscultation       Cardiovascular negative cardio ROS Normal cardiovascular exam Rhythm:Regular Rate:Normal     Neuro/Psych  Headaches,  Neuromuscular disease negative psych ROS   GI/Hepatic Neg liver ROS, GERD  ,  Endo/Other  negative endocrine ROS  Renal/GU negative Renal ROS     Musculoskeletal  (+) Arthritis ,   Abdominal (+) + obese,   Peds  Hematology negative hematology ROS (+)   Anesthesia Other Findings   Reproductive/Obstetrics negative OB ROS                             Anesthesia Physical Anesthesia Plan  ASA: II  Anesthesia Plan: General   Post-op Pain Management:    Induction: Intravenous  Airway Management Planned: Oral ETT and Mask  Additional Equipment:   Intra-op Plan:   Post-operative Plan: Extubation in OR  Informed Consent: I have reviewed the patients History and Physical, chart, labs and discussed the procedure including the risks, benefits and alternatives for the proposed anesthesia with the patient or authorized representative who has indicated his/her understanding and acceptance.   Dental advisory given  Plan Discussed with: CRNA  Anesthesia Plan Comments:         Anesthesia Quick Evaluation

## 2016-09-15 NOTE — H&P (Signed)
Cc: Chronic cough  HPI: The patient is a 51 y/o female who presents today for evaluation of chronic cough. The patient is seen in consultation requested by Dr. Redmond School. The patient has a history of chronic bronchitis, however her symptoms became worse 3 months ago. She was initially treated for acute bronchitis without any improvement in her cough. The patient was treated with antibiotics and steroids. She had two chest x-rays which per the patient were normal. The cough is non productive. The patient has intermittent reflux symptoms and takes omeprazole prn. She denies dysphagia, odynophagia, or hoarseness. The patient is a 35+ pack year smoker but has recently been decreasing her tobacco use. The patient had a tonsillectomy in the past.   The patient's review of systems (constitutional, eyes, ENT, cardiovascular, respiratory, GI, musculoskeletal, skin, neurologic, psychiatric, endocrine, hematologic, allergic) is noted in the ROS questionnaire.  It is reviewed with the patient.   Family health history: Colon cancer.   Major events: Tonsillectomy, gallbladder removed, hysterectomy, arm surgery.   Ongoing medical problems: GERD, headache, migraines.   Social history: 35+ pack year tobacco use.  Exam General: Communicates without difficulty, well nourished, no acute distress. Head: Normocephalic, no evidence injury, no tenderness, facial buttresses intact without stepoff. Eyes: PERRL, EOMI. No scleral icterus, conjunctivae clear. Neuro: CN II exam reveals vision grossly intact.  No nystagmus at any point of gaze. Bilateral cerumen impaction. Nose: Normal skin and external support.  Anterior rhinoscopy reveals healthy pink mucosa over the septum and turbinates.  No lesions or polyps were seen. Oral cavity: Lips without lesions, oral mucosa moist, no masses or lesions seen. Indirect  mirror laryngoscopy could not be tolerated. Pharynx: Clear, no erythema. Neck: Supple, full range of motion, no  lymphadenopathy, no masses palpable. Salivary: Parotid and submandibular glands without mass. Neuro:  CN 2-12 grossly intact. Gait normal. Vestibular: No nystagmus at any point of gaze.   Procedure:  Flexible Fiberoptic Laryngoscopy -- Risks, benefits, and alternatives of flexible endoscopy were explained to the patient.  Specific mention was made of the risk of throat numbness with difficulty swallowing, possible bleeding from the nose and mouth, and pain from the procedure.  The patient gave oral consent to proceed.  The nasal cavities were decongested and anesthetised with a combination of oxymetazoline and 4% lidocaine solution.  The flexible scope was inserted into the right nasal cavity and advanced towards the nasopharynx.  Visualized mucosa over the turbinates and septum were normal.  The nasopharynx was clear.  Oropharyngeal walls were symmetric and mobile without lesion, mass, or edema.  Hypopharynx was also without  lesion or edema.  Larynx was mobile without lesions.  No lesions or asymmetry in the supraglottic larynx.  Arytenoid mucosa was severely edematous with slight erythema.  True vocal folds were pale yellow and edematous but with a large polyp noted on the right.  Base of tongue was within normal limits. The patient tolerated the procedure well.   Assessment 1.  The patient's cough is likely a result of her laryngopharyngeal reflux.  Severe posterior laryngeal edema is noted today.   2.  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 No other suspicious mass or lesion is noted on today's fiberoptic laryngoscopy exam. 3.  Bilateral cerumen impaction. 4.  Tobacco dependence.   Plan  1.  The physical exam and laryngoscopy findings are reviewed with the patient.  2.  Information on laryngopharyngeal reflux is discussed with the patient. Behavior modifications that could improve the symptoms  are also discussed. 3.  Omeprazole 20 mg daily. 4.  Tobacco cessation is extensively discussed and  strongly encouraged.  5.  Plan micro DL with excision of her right vocal cord polyp. The risks, benefits, alternatives, and details of the procedure are reviewed with the patient. Questions are invited and answered. 6.  The patient is interested in proceeding with the procedure.  We will schedule the procedure in accordance with the family schedule.  7.  Otomicroscopy with bilateral cerumen removal.

## 2016-09-15 NOTE — Op Note (Signed)
DATE OF PROCEDURE:  09/15/2016                              OPERATIVE REPORT  SURGEON:  Leta Baptist, MD  PREOPERATIVE DIAGNOSES: 1. Right vocal cord polyp.  POSTOPERATIVE DIAGNOSES: 1. Right vocal cord polyp.  PROCEDURE PERFORMED:  Microdirect laryngoscopy with excision of right vocal cord polyp  ANESTHESIA:  General endotracheal tube anesthesia.  COMPLICATIONS:  None.  ESTIMATED BLOOD LOSS:  Minimal.  INDICATION FOR PROCEDURE:  Danielle Montoya is a 51 y.o. female with a history of chronic cough. On her laryngoscopy examination, the patient was noted to have a large polypoid tissue covering her right vocal cord. The patient has a 30 pack year smoking history. No other suspicious mass or lesion was noted. Based on the above findings, the decision was made for the patient to undergo the above stated procedure.  The risks, benefits, alternatives, and details of the procedure were discussed with the patient.  Questions were invited and answered.  Informed consent was obtained.  DESCRIPTION:  The patient was taken to the operating room and placed supine on the operating table.  General endotracheal tube anesthesia was administered by the anesthesiologist.  The patient was positioned and prepped and draped in a standard fashion for direct laryngoscopy examination.  A Dedo laryngoscope was used for examination. The laryngoscope was inserted via the oral cavity into the pharynx. Examinations of the epiglottis, aryepiglottic folds, vallecula, and piriform sinuses were all normal. The patient's left vocal cord was noted to be normal. The right vocal cord was covered with polypoid tissue. The Dedo laryngoscope was suspended with a Lewy suspender. Photodocumentation of the findings was obtained. An operating microscope was brought into the field. Under the operating microscope, the right vocal cord polyp was excised using a combination of laryngeal micro-forceps and laryngeal scissors. The specimens were sent  to the pathology department for permanent histologic identification. Hemostasis was achieved with pledgets soaked with epinephrine.  The care of the patient was turned over to the anesthesiologist.  The patient was awakened from anesthesia without difficulty.  The patient was extubated and transferred to the recovery room in good condition.  OPERATIVE FINDINGS:  Right vocal cord polyp.  SPECIMEN:  Right vocal cord polyp.  FOLLOWUP CARE:  The patient will be discharged home once awake and alert.   The patient will follow up in my office in approximately 1 week.  Danielle Montoya 09/15/2016 11:56 AM

## 2016-09-15 NOTE — Anesthesia Procedure Notes (Signed)
Performed by: Iren Whipp W       

## 2016-09-15 NOTE — Transfer of Care (Signed)
Immediate Anesthesia Transfer of Care Note  Patient: Danielle Montoya  Procedure(s) Performed: Procedure(s): RIGHT VOCAL CORD MICRO DIRECT LARYNGOSCOPY WITH BIOPSY (Right)  Patient Location: PACU  Anesthesia Type:General  Level of Consciousness: awake and alert   Airway & Oxygen Therapy: Patient Spontanous Breathing and Patient connected to face mask oxygen  Post-op Assessment: Report given to RN and Post -op Vital signs reviewed and stable  Post vital signs: Reviewed and stable  Last Vitals:  Vitals:   09/15/16 1207 09/15/16 1208  BP:  139/76  Pulse: 89 89  Resp: 12 17  Temp:  (P) 36.9 C    Last Pain:  Vitals:   09/15/16 0943  TempSrc: Oral  PainSc: 7       Patients Stated Pain Goal: 2 ([id number])  Complications: No apparent anesthesia complications

## 2016-09-15 NOTE — Anesthesia Procedure Notes (Signed)
Procedure Name: Intubation Date/Time: 09/15/2016 11:33 AM Performed by: Lieutenant Diego Pre-anesthesia Checklist: Patient identified, Emergency Drugs available, Suction available and Patient being monitored Patient Re-evaluated:Patient Re-evaluated prior to inductionOxygen Delivery Method: Circle system utilized Preoxygenation: Pre-oxygenation with 100% oxygen Intubation Type: IV induction Ventilation: Mask ventilation without difficulty Laryngoscope Size: Miller and 2 Grade View: Grade I Tube type: Oral Tube size: 6.0 mm Number of attempts: 1 Airway Equipment and Method: Stylet and Oral airway Placement Confirmation: ETT inserted through vocal cords under direct vision,  positive ETCO2 and breath sounds checked- equal and bilateral Secured at: 24 cm Tube secured with: Tape Dental Injury: Teeth and Oropharynx as per pre-operative assessment

## 2016-09-15 NOTE — Transfer of Care (Signed)
Immediate Anesthesia Transfer of Care Note  Patient: Danielle Montoya  Procedure(s) Performed: Procedure(s): RIGHT VOCAL CORD MICRO DIRECT LARYNGOSCOPY WITH BIOPSY (Right)  Patient Location: PACU  Anesthesia Type:General  Level of Consciousness: awake and alert   Airway & Oxygen Therapy: Patient Spontanous Breathing and Patient connected to face mask oxygen  Post-op Assessment: Report given to RN and Post -op Vital signs reviewed and stable  Post vital signs: Reviewed and stable  Last Vitals:  Vitals:   09/15/16 0943  BP: (!) 142/79  Pulse: 84  Resp: 18  Temp: 37.2 C    Last Pain:  Vitals:   09/15/16 0943  TempSrc: Oral  PainSc: 7       Patients Stated Pain Goal: 2 ([id number])  Complications: No apparent anesthesia complications

## 2016-09-15 NOTE — Anesthesia Preprocedure Evaluation (Signed)
Anesthesia Evaluation    Airway       Dental   Pulmonary Current Smoker,          Cardiovascular     Neuro/Psych    GI/Hepatic   Endo/Other    Renal/GU      Musculoskeletal   Abdominal   Peds  Hematology   Anesthesia Other Findings   Reproductive/Obstetrics                           Anesthesia Physical Anesthesia Plan Anesthesia Quick Evaluation  

## 2016-09-16 ENCOUNTER — Encounter (HOSPITAL_BASED_OUTPATIENT_CLINIC_OR_DEPARTMENT_OTHER): Payer: Self-pay | Admitting: Otolaryngology

## 2016-09-25 ENCOUNTER — Ambulatory Visit (INDEPENDENT_AMBULATORY_CARE_PROVIDER_SITE_OTHER): Payer: BLUE CROSS/BLUE SHIELD | Admitting: Otolaryngology

## 2016-09-25 DIAGNOSIS — R490 Dysphonia: Secondary | ICD-10-CM | POA: Diagnosis not present

## 2016-09-25 DIAGNOSIS — F17210 Nicotine dependence, cigarettes, uncomplicated: Secondary | ICD-10-CM

## 2016-09-25 DIAGNOSIS — J381 Polyp of vocal cord and larynx: Secondary | ICD-10-CM | POA: Diagnosis not present

## 2017-03-26 ENCOUNTER — Ambulatory Visit (INDEPENDENT_AMBULATORY_CARE_PROVIDER_SITE_OTHER): Payer: BLUE CROSS/BLUE SHIELD | Admitting: Otolaryngology

## 2017-05-04 ENCOUNTER — Ambulatory Visit (INDEPENDENT_AMBULATORY_CARE_PROVIDER_SITE_OTHER): Payer: BLUE CROSS/BLUE SHIELD | Admitting: Otolaryngology

## 2017-05-25 ENCOUNTER — Ambulatory Visit (INDEPENDENT_AMBULATORY_CARE_PROVIDER_SITE_OTHER): Payer: BLUE CROSS/BLUE SHIELD | Admitting: Otolaryngology

## 2018-02-25 ENCOUNTER — Emergency Department: Payer: Self-pay

## 2018-02-25 ENCOUNTER — Other Ambulatory Visit: Payer: Self-pay

## 2018-02-25 ENCOUNTER — Emergency Department
Admission: EM | Admit: 2018-02-25 | Discharge: 2018-02-25 | Disposition: A | Discharge status: Home / Self Care | Payer: Self-pay | Attending: Emergency Medicine | Admitting: Emergency Medicine

## 2018-02-25 DIAGNOSIS — D3500 Benign neoplasm of unspecified adrenal gland: Secondary | ICD-10-CM

## 2018-02-25 DIAGNOSIS — F17210 Nicotine dependence, cigarettes, uncomplicated: Secondary | ICD-10-CM | POA: Insufficient documentation

## 2018-02-25 DIAGNOSIS — Z79899 Other long term (current) drug therapy: Secondary | ICD-10-CM | POA: Insufficient documentation

## 2018-02-25 DIAGNOSIS — R2242 Localized swelling, mass and lump, left lower limb: Secondary | ICD-10-CM | POA: Insufficient documentation

## 2018-02-25 DIAGNOSIS — R079 Chest pain, unspecified: Secondary | ICD-10-CM | POA: Insufficient documentation

## 2018-02-25 LAB — COMPREHENSIVE METABOLIC PANEL
ALK PHOS: 81 U/L (ref 38–126)
ALT: 21 U/L (ref 0–44)
AST: 21 U/L (ref 15–41)
Albumin: 3.9 g/dL (ref 3.5–5.0)
Anion gap: 7 (ref 5–15)
BILIRUBIN TOTAL: 0.7 mg/dL (ref 0.3–1.2)
BUN: 6 mg/dL (ref 6–20)
CALCIUM: 9.4 mg/dL (ref 8.9–10.3)
CO2: 26 mmol/L (ref 22–32)
CREATININE: 0.55 mg/dL (ref 0.44–1.00)
Chloride: 107 mmol/L (ref 98–111)
GFR calc Af Amer: >60 mL/min (ref >60)
GLUCOSE: 125 mg/dL — AB (ref 70–99)
POTASSIUM: 3.8 mmol/L (ref 3.5–5.1)
Sodium: 140 mmol/L (ref 135–145)
Total Protein: 7.3 g/dL (ref 6.5–8.1)

## 2018-02-25 LAB — CBC WITH DIFFERENTIAL/PLATELET
BASOS ABS: 0.1 10*3/uL (ref 0–0.1)
Basophils Relative: 1 %
Eosinophils Absolute: 0.1 10*3/uL (ref 0–0.7)
Eosinophils Relative: 1 %
HEMATOCRIT: 42.4 % (ref 35.0–47.0)
HEMOGLOBIN: 14.5 g/dL (ref 12.0–16.0)
LYMPHS PCT: 35 %
Lymphs Abs: 3.4 10*3/uL (ref 1.0–3.6)
MCH: 32.5 pg (ref 26.0–34.0)
MCHC: 34.3 g/dL (ref 32.0–36.0)
MCV: 94.8 fL (ref 80.0–100.0)
MONO ABS: 0.7 10*3/uL (ref 0.2–0.9)
Monocytes Relative: 7 %
NEUTROS ABS: 5.6 10*3/uL (ref 1.4–6.5)
Neutrophils Relative %: 56 %
Platelets: 161 10*3/uL (ref 150–440)
RBC: 4.47 MIL/uL (ref 3.80–5.20)
RDW: 12.9 % (ref 11.5–14.5)
WBC: 9.9 10*3/uL (ref 3.6–11.0)

## 2018-02-25 LAB — TROPONIN I
Troponin I: <0.03 ng/mL (ref <0.03)
Troponin I: <0.03 ng/mL (ref <0.03)

## 2018-02-25 MED ORDER — IOHEXOL 350 MG/ML SOLN
75.0000 mL | Freq: Once | INTRAVENOUS | Status: AC | PRN
  Administered 2018-02-25: 75 mL via INTRAVENOUS

## 2018-02-25 MED ORDER — NITROGLYCERIN 0.4 MG SL SUBL
0.4000 mg | SUBLINGUAL_TABLET | SUBLINGUAL | Status: DC | PRN
  Administered 2018-02-25: 0.4 mg via SUBLINGUAL
  Filled 2018-02-25: qty 1

## 2018-02-25 MED ORDER — CLOPIDOGREL BISULFATE 75 MG PO TABS
300.0000 mg | ORAL_TABLET | Freq: Once | ORAL | Status: AC
  Administered 2018-02-25: 300 mg via ORAL
  Filled 2018-02-25: qty 4

## 2018-02-25 MED ORDER — ONDANSETRON HCL 4 MG/2ML IJ SOLN
4.0000 mg | Freq: Once | INTRAMUSCULAR | Status: AC
  Administered 2018-02-25: 4 mg via INTRAVENOUS
  Filled 2018-02-25: qty 2

## 2018-02-25 MED ORDER — KETOROLAC TROMETHAMINE 30 MG/ML IJ SOLN
30.0000 mg | Freq: Once | INTRAMUSCULAR | Status: AC
  Administered 2018-02-25: 30 mg via INTRAVENOUS
  Filled 2018-02-25: qty 1

## 2018-02-25 NOTE — ED Notes (Signed)
Pt reports that she feels as if she is getting a migraine to sides of head 9/10--st hx of same; st that she normally takes injection of toradol, zofran, and demerol; Dr Jacqualine Code notified and orders obtained; verified with pt that she has no allergic rx from toradol as her allergies list ASA; pt reports that she tolerates this med without problems

## 2018-02-25 NOTE — ED Notes (Signed)
Pt returned from u/s and CT; resting quietly on stretcher in exam room with no distress noted; SO at bedside; pt placed on card monitor for further eval; pt reports feeling some better now with CP 7/10; pt voices understanding of plan of care

## 2018-02-25 NOTE — ED Notes (Signed)
Informed RN that patient has been roomed and is ready for evaluation.  Patient in NAD at this time and call bell placed within reach.   

## 2018-02-25 NOTE — ED Notes (Signed)
Pt to u/s via stretcher accomp by u/s tech 

## 2018-02-25 NOTE — ED Triage Notes (Signed)
Pt reports that she started having chest pain this am - the pain started to radiate down left arm approx 30 min ago and she reports that her left arm is numb - pt c/o shortness of breath

## 2018-02-25 NOTE — ED Provider Notes (Signed)
[facility] Emergency Department Provider Note   ____________________________________________   First MD Initiated Contact with Patient 02/25/18 1812     (approximate)  I have reviewed the triage vital signs and the nursing notes.   HISTORY  Chief Complaint Chest Pain    HPI Danielle Montoya is a 52 y.o. female began experiencing a pain over the left chest this morning while at rest at about 8 AM.  Is been fairly persistent, she went and spent time with her husband.  Was out at dinner going ready to get to dinner tonight and she reports the pain seemed to get worse started radiating into her left upper arm, she having some tingling and numbness in her left hand but reports that is chronic because of a prior surgeries that left her with a chronic tingling and slight weakness in the muscles of the left hand for years.  No radiation of the back.  Pain is worsened by taking deep breath.  Described as a sharp to pressure-like feeling in the left chest with radiation to left arm.  No radiation to the back or jaw.  Has not taken any medication prior to arrival.  Reports she has a notable allergy to aspirin that she cannot tolerated and it causes confusion and other problems for her.  Currently pain is moderate, left-sided in the left chest rating in the left arm.  Some nausea but no vomiting.  Slight feeling of shortness of breath when the pain got worse.  Denies history of blood clots.  She is a smoker.  Denies personal history of heart problems.  No strong family history heart disease.  She has chronic swelling in her left lower leg, but reports this morning she noticed that her left ankle just seemed [place] little bit more swollen than it normally does.    Past Medical History:  Diagnosis Date  . Anxiety   . Arthritis    right knee and left arm  . Depression   . GERD (gastroesophageal reflux disease)   . Headache(784.0)    Migraines  . Seasonal allergies   .  Torn muscle    Left arm    Patient Active Problem List   Diagnosis Date Noted  . Diarrhea 04/28/2012  . Blood in stool 04/28/2012  . ANKLE SPRAIN 03/06/2010  . SPONDYLOSIS 12/18/2009  . H N P-LUMBAR 12/05/2009  . SCIATICA 12/05/2009  . KNEE PAIN 08/22/2009  . IMPINGEMENT SYNDROME 05/29/2009  . SHOULDER PAIN, LEFT 04/16/2009  . CONTUSION OF UPPER ARM 04/16/2009  . BURSITIS, HIP 06/10/2007    Past Surgical History:  Procedure Laterality Date  . ABDOMINAL HYSTERECTOMY    . arm surgery    . CHOLECYSTECTOMY     dysfunctional  . COLONOSCOPY  06/10/2012   Procedure: COLONOSCOPY;  Surgeon: Rogene Houston, MD;  [facility];  Service: Endoscopy;  Laterality: N/A;  1200  . MICROLARYNGOSCOPY Right 09/15/2016   Procedure: RIGHT VOCAL CORD MICRO DIRECT LARYNGOSCOPY WITH BIOPSY;  Surgeon: Leta Baptist, MD;  Location: [place];  Service: ENT;  Laterality: Right;  . TONSILLECTOMY      Prior to Admission medications   Medication Sig Start Date End Date Taking? Authorizing Provider  albuterol (ACCUNEB) 0.63 MG/3ML nebulizer solution Take 1 ampule by nebulization every 6 (six) hours as needed for wheezing.    [provider]  albuterol (ACCUNEB) 0.63 MG/3ML nebulizer solution Take 1 ampule by nebulization every 6 (six) hours as needed for  wheezing.    [provider]  albuterol (PROVENTIL) 4 MG tablet Take 4 mg by mouth 4 (four) times daily as needed for wheezing.    [provider]  butalbital-acetaminophen-caffeine (FIORICET, ESGIC) 50-325-40 MG per tablet Take 1 tablet by mouth every 6 (six) hours as needed for headache or migraine.    [provider]  chlorpheniramine-HYDROcodone (TUSSIONEX PENNKINETIC ER) 10-8 MG/5ML LQCR Take 5 mLs by mouth 2 (two) times daily. 06/19/14   Ashley Murrain, NP  fluticasone (FLONASE) 50 MCG/ACT nasal spray Place 2 sprays into the nose daily as needed for allergies.     [provider]    guaiFENesin (ROBITUSSIN) 100 MG/5ML SOLN Take 5 mLs by mouth every 4 (four) hours as needed for cough or to loosen phlegm.    [provider]  LORazepam (ATIVAN) 1 MG tablet Take 1 mg by mouth every 8 (eight) hours.    [provider]  ranitidine (ZANTAC) 150 MG tablet Take 150 mg by mouth 2 (two) times daily.    [provider]  tiZANidine (ZANAFLEX) 4 MG capsule Take 4 mg by mouth 3 (three) times daily as needed for muscle spasms.    [provider]  topiramate (TOPAMAX) 25 MG tablet Take 75-100 mg by mouth 2 (two) times daily. PATIENT TAKES 75MG  IN THE MORNING AND 100MG  AT BEDTIME    [provider]  traMADol (ULTRAM) 50 MG tablet Take by mouth every 6 (six) hours as needed.    [provider]  traZODone (DESYREL) 100 MG tablet Take 100 mg by mouth at bedtime.    [provider]    Allergies Penicillins; Prednisone; Shellfish allergy; Aspirin; and Erythromycin  Family History  Problem Relation Age of Onset  . Colon cancer Mother     Social History Social History   Tobacco Use  . Smoking status: Current Every Day Smoker    Packs/day: 1.00    Years: 32.00    Pack years: 32.00    Types: Cigarettes  . Smokeless tobacco: Never Used  . Tobacco comment: 1/2 pack a day. Smoking since age 89.  Substance Use Topics  . Alcohol use: No  . Drug use: No    Review of Systems Constitutional: No fever/chills Eyes: No visual changes. ENT: No sore throat. Cardiovascular: D see HPI respiratory: See HPI Gastrointestinal: No abdominal pain.  No vomiting.  No diarrhea.  No constipation. Genitourinary: Negative for dysuria. Musculoskeletal: Negative for back pain. Skin: Negative for rash. Neurological: Negative for headaches, focal weakness or numbness.    ____________________________________________   PHYSICAL EXAM:  VITAL SIGNS: ED Triage Vitals  Enc Vitals Group     BP 02/25/18 1720 (!) 146/79     Pulse Rate  02/25/18 1720 87     Resp 02/25/18 1720 15     Temp 02/25/18 1720 97.9 F (36.6 C)     Temp Source 02/25/18 1720 Oral     SpO2 02/25/18 1720 95 %     Weight 02/25/18 1718 179 lb (81.2 kg)     Height 02/25/18 1718 5\' 1"  (1.549 m)     Head Circumference --      Peak Flow --      Pain Score 02/25/18 1718 10     Pain Loc --      Pain Edu? --      Excl. in [place]? --     Constitutional: Alert and oriented. Well appearing and in no acute distress. Eyes: Conjunctivae are normal.  Head: Atraumatic. Nose: No congestion/rhinnorhea. Mouth/Throat: Mucous membranes are moist. Neck: No stridor.   Cardiovascular: Normal rate, regular rhythm. Grossly normal heart sounds.  Good peripheral circulation.  Easily reproducible tenderness over the left chest wall.  Denies pain in the breast itself. Respiratory: Normal respiratory effort.  No retractions. Lungs CTAB. Gastrointestinal: Soft and nontender. No distention. Musculoskeletal: No lower extremity tenderness trace edema about the left ankle in the left thigh is slightly more swollen than the right, but the patient reports the left thigh has been chronically swollen for a very long time without change except the ankle seems to have just a little bit of swelling around it without any injury or fall.  Denies pain in the leg or ankle. Neurologic:  Normal speech and language. No gross focal neurologic deficits are appreciated.  Skin:  Skin is warm, dry and intact. No rash noted. Psychiatric: Mood and affect are normal. Speech and behavior are normal.  ____________________________________________   LABS (all labs ordered are listed, but only abnormal results are displayed)  Labs Reviewed  COMPREHENSIVE METABOLIC PANEL - Abnormal; Notable for the following components:      Result Value   Glucose, Bld 125 (*)    All other components within normal limits  TROPONIN I  CBC WITH DIFFERENTIAL/PLATELET  TROPONIN I    ____________________________________________  EKG  Reviewed enterotomy at 1720 Heart rate 90 QRS 80 QTc 460 Normal sinus rhythm, a nonspecific T wave abnormality is noted in V3 including a slight T wave inversion.  Nonspecific, no previous for comparison. ____________________________________________  RADIOLOGY  Dg Chest 2 View  Result Date: 02/25/2018 CLINICAL DATA:  Chest pain since this morning which shortness-of-breath. EXAM: CHEST - 2 VIEW COMPARISON:  07/16/2016 FINDINGS: Lungs are hypoinflated without focal airspace consolidation or effusion. Cardiomediastinal silhouette and remainder the exam is unchanged. IMPRESSION: Hypoinflation without acute cardiopulmonary disease. Electronically Signed   By: Marin Olp M.D.   On: 02/25/2018 17:50   Ct Angio Chest Pe W And/or Wo Contrast  Result Date: 02/25/2018 CLINICAL DATA:  Chest pain left arm numbness, short of breath EXAM: CT ANGIOGRAPHY CHEST WITH CONTRAST TECHNIQUE: Multidetector CT imaging of the chest was performed using the standard protocol during bolus administration of intravenous contrast. Multiplanar CT image reconstructions and MIPs were obtained to evaluate the vascular anatomy. CONTRAST:  22mL OMNIPAQUE IOHEXOL 350 MG/ML SOLN COMPARISON:  02/25/2018 radiograph, CT 10/01/2013 FINDINGS: Cardiovascular: Satisfactory opacification of the pulmonary arteries to the segmental level. No evidence of pulmonary embolism. Nonaneurysmal aorta. Mild aortic atherosclerosis. Mild coronary vascular calcification. Normal heart size. No pericardial effusion Mediastinum/Nodes: No enlarged mediastinal, hilar, or axillary lymph nodes. Midline trachea. Esophagus within normal limits. 8 mm rim calcified nodule in the right lobe of thyroid. Lungs/Pleura: Lungs are clear. No pleural effusion or pneumothorax. Upper Abdomen: Hypodense mass in the posterior right hepatic lobe measuring 4.3 x 4.7 cm, slight increase in size and corresponding to hemangioma  noted on prior study. 2.1 cm fat density mass in the right adrenal gland, likely an adenoma Musculoskeletal: No chest wall abnormality. No acute or significant osseous findings. Review of the MIP images confirms the above findings. IMPRESSION: 1. Negative for acute pulmonary embolus or aortic dissection. 2. Clear lung fields. 3. Hepatic hemangioma 4. Probable right adrenal gland adenoma Aortic Atherosclerosis (ICD10-I70.0). Electronically Signed   By: Donavan Foil M.D.   On: 02/25/2018 19:56   US Venous Img Lower Unilateral Left  Result Date: 02/25/2018 CLINICAL DATA:  Left leg edema EXAM: Left LOWER  EXTREMITY VENOUS DOPPLER ULTRASOUND TECHNIQUE: Gray-scale sonography with graded compression, as well as color Doppler and duplex ultrasound were performed to evaluate the lower extremity deep venous systems from the level of the common femoral vein and including the common femoral, femoral, profunda femoral, popliteal and calf veins including the posterior tibial, peroneal and gastrocnemius veins when visible. The superficial great saphenous vein was also interrogated. Spectral Doppler was utilized to evaluate flow at rest and with distal augmentation maneuvers in the common femoral, femoral and popliteal veins. COMPARISON:  None. FINDINGS: Contralateral Common Femoral Vein: Respiratory phasicity is normal and symmetric with the symptomatic side. No evidence of thrombus. Normal compressibility. Common Femoral Vein: No evidence of thrombus. Normal compressibility, respiratory phasicity and response to augmentation. Saphenofemoral Junction: No evidence of thrombus. Normal compressibility and flow on color Doppler imaging. Profunda Femoral Vein: No evidence of thrombus. Normal compressibility and flow on color Doppler imaging. Femoral Vein: No evidence of thrombus. Normal compressibility, respiratory phasicity and response to augmentation. Popliteal Vein: No evidence of thrombus. Normal compressibility, respiratory  phasicity and response to augmentation. Calf Veins: No evidence of thrombus. Normal compressibility and flow on color Doppler imaging. Other Findings:  None. IMPRESSION: No evidence of deep venous thrombosis. Electronically Signed   By: Donavan Foil M.D.   On: 02/25/2018 19:57    CT negative for PE.  Reviewed by me.  Possible right adrenal gland adenoma.  Discussed adenoma finding with patient as well, recommended primary follow-up on this patient agreeable. ____________________________________________   PROCEDURES  Procedure(s) performed: None  Procedures  Critical Care performed: No  ____________________________________________   INITIAL IMPRESSION / ASSESSMENT AND PLAN / ED COURSE  Pertinent labs & imaging results that were available during my care of the patient were reviewed by me and considered in my medical decision making (see chart for details).  Moderate pretest probability for PE given left leg swelling and a slight pleuritic component of left-sided chest pain.  Will obtain ultrasound to evaluate for left lower extremity DVT and also CT scan with angiography to exclude pulmonary embolism given the patient's left-sided slightly pleuritic pain, as well as edema noted in unilaterally left.  EKG shows nonspecific T wave abnormality, no obvious ischemia though again nonspecific.  First troponin is normal.  Administer Plavix as patient reports cannot tolerate aspirin.  Hemodynamic stable.  As we discussed, the patient and her husband report that there is a notable financial issue as they do not have insurance.  Thus, we have had these shared medical decision making I encouraged that given her symptoms and leg swelling CT angiogram is recommended to exclude a blood clot, she is agreeable with this and ultrasound but they report and lesser blood work of the heart shows something concerning they would not want to be admitted likely.  I discussed with them, and we will reevaluate this as  we move forward though I did discuss that my recommendation would be that given her concerning symptoms and slightly abnormal EKG I would recommend at least observation in the hospital.  She and her husband will continue to think about it, but at this point will continue ER care and anticipate at least a second troponin draw before we will have a clear decision on their plan is to whether or not they would like to be admitted given financial constraints.     Clinical Course as of Feb 25 2199  Thu Feb 25, 2018  2156 Discussed case with Dr. Marella Bile, he can follow the patient  up tomorrow at 2 PM at his clinic.  Patient agreeable with this plan and will see Dr. Yancey Flemings tomorrow at 2 PM for follow-up regarding chest pain.  I did offer admission, but patient declined with shared medical decision-making understanding we cannot exclude heart attack or early signs of heart problems based on ER testing alone.  She will follow-up closely and I discussed with both her and her husband very careful chest pain precautions for return to the ER and she is in agreement.  She is currently pain-free.   [MQ]    Clinical Course User Index [MQ] Delman Kitten, MD   Return precautions and treatment recommendations and follow-up discussed with the patient who is agreeable with the plan.   ____________________________________________   FINAL CLINICAL IMPRESSION(S) / ED DIAGNOSES  Final diagnoses:  Chest pain, moderate coronary artery risk      NEW MEDICATIONS STARTED DURING THIS VISIT:  New Prescriptions   No medications on file     Note:  This document was prepared using Dragon voice recognition software and may include unintentional dictation errors.     Delman Kitten, MD 02/25/18 2200

## 2018-02-25 NOTE — Discharge Instructions (Addendum)
You have been seen in the Emergency Department (ED) today for chest pain.  As we have discussed today?s test results are normal, but you may require further testing.  Please follow up with Dr. Humphrey Rolls (cardiology) tomorrow at [facility] as instructed above in these documents regarding today?s emergent visit and your recent symptoms to discuss further management.  Continue to take your regular medications.    Return to the Emergency Department (ED) if you experience any further chest pain/pressure/tightness, difficulty breathing, or sudden sweating, or other symptoms that concern you.

## 2018-02-25 NOTE — ED Notes (Signed)
Dr Jacqualine Code at bedside; pt st HA persists; MD speaking with pt regarding plan of care; pt taking sips sprint at present

## 2019-02-07 DIAGNOSIS — G43109 Migraine with aura, not intractable, without status migrainosus: Secondary | ICD-10-CM | POA: Diagnosis not present

## 2019-02-07 DIAGNOSIS — E6609 Other obesity due to excess calories: Secondary | ICD-10-CM | POA: Diagnosis not present

## 2019-02-07 DIAGNOSIS — Z6832 Body mass index (BMI) 32.0-32.9, adult: Secondary | ICD-10-CM | POA: Diagnosis not present

## 2019-02-07 DIAGNOSIS — R110 Nausea: Secondary | ICD-10-CM | POA: Diagnosis not present

## 2019-02-16 DIAGNOSIS — R110 Nausea: Secondary | ICD-10-CM | POA: Diagnosis not present

## 2019-02-16 DIAGNOSIS — J449 Chronic obstructive pulmonary disease, unspecified: Secondary | ICD-10-CM | POA: Diagnosis not present

## 2019-02-16 DIAGNOSIS — G43009 Migraine without aura, not intractable, without status migrainosus: Secondary | ICD-10-CM | POA: Diagnosis not present

## 2019-02-16 DIAGNOSIS — Z6832 Body mass index (BMI) 32.0-32.9, adult: Secondary | ICD-10-CM | POA: Diagnosis not present

## 2019-02-16 DIAGNOSIS — I700 Atherosclerosis of aorta: Secondary | ICD-10-CM | POA: Diagnosis not present

## 2019-03-14 DIAGNOSIS — R110 Nausea: Secondary | ICD-10-CM | POA: Diagnosis not present

## 2019-03-14 DIAGNOSIS — G43109 Migraine with aura, not intractable, without status migrainosus: Secondary | ICD-10-CM | POA: Diagnosis not present

## 2019-04-06 DIAGNOSIS — G4709 Other insomnia: Secondary | ICD-10-CM | POA: Diagnosis not present

## 2019-04-06 DIAGNOSIS — Z6832 Body mass index (BMI) 32.0-32.9, adult: Secondary | ICD-10-CM | POA: Diagnosis not present

## 2019-04-06 DIAGNOSIS — G894 Chronic pain syndrome: Secondary | ICD-10-CM | POA: Diagnosis not present

## 2019-04-06 DIAGNOSIS — G43109 Migraine with aura, not intractable, without status migrainosus: Secondary | ICD-10-CM | POA: Diagnosis not present

## 2019-04-19 DIAGNOSIS — Z6833 Body mass index (BMI) 33.0-33.9, adult: Secondary | ICD-10-CM | POA: Diagnosis not present

## 2019-04-19 DIAGNOSIS — G43009 Migraine without aura, not intractable, without status migrainosus: Secondary | ICD-10-CM | POA: Diagnosis not present

## 2019-04-19 DIAGNOSIS — E6609 Other obesity due to excess calories: Secondary | ICD-10-CM | POA: Diagnosis not present

## 2019-05-03 DIAGNOSIS — G43109 Migraine with aura, not intractable, without status migrainosus: Secondary | ICD-10-CM | POA: Diagnosis not present

## 2019-05-03 DIAGNOSIS — J45909 Unspecified asthma, uncomplicated: Secondary | ICD-10-CM | POA: Diagnosis not present

## 2019-05-09 DIAGNOSIS — G43109 Migraine with aura, not intractable, without status migrainosus: Secondary | ICD-10-CM | POA: Diagnosis not present

## 2019-05-26 DIAGNOSIS — R110 Nausea: Secondary | ICD-10-CM | POA: Diagnosis not present

## 2019-05-26 DIAGNOSIS — G43109 Migraine with aura, not intractable, without status migrainosus: Secondary | ICD-10-CM | POA: Diagnosis not present

## 2019-05-26 DIAGNOSIS — Z6833 Body mass index (BMI) 33.0-33.9, adult: Secondary | ICD-10-CM | POA: Diagnosis not present

## 2019-05-26 DIAGNOSIS — E6609 Other obesity due to excess calories: Secondary | ICD-10-CM | POA: Diagnosis not present

## 2019-06-07 DIAGNOSIS — G43109 Migraine with aura, not intractable, without status migrainosus: Secondary | ICD-10-CM | POA: Diagnosis not present

## 2019-06-07 DIAGNOSIS — Z6833 Body mass index (BMI) 33.0-33.9, adult: Secondary | ICD-10-CM | POA: Diagnosis not present

## 2019-06-07 DIAGNOSIS — E6609 Other obesity due to excess calories: Secondary | ICD-10-CM | POA: Diagnosis not present

## 2019-06-07 DIAGNOSIS — S46012A Strain of muscle(s) and tendon(s) of the rotator cuff of left shoulder, initial encounter: Secondary | ICD-10-CM | POA: Diagnosis not present

## 2019-06-21 DIAGNOSIS — R110 Nausea: Secondary | ICD-10-CM | POA: Diagnosis not present

## 2019-06-21 DIAGNOSIS — Z6832 Body mass index (BMI) 32.0-32.9, adult: Secondary | ICD-10-CM | POA: Diagnosis not present

## 2019-06-21 DIAGNOSIS — H81392 Other peripheral vertigo, left ear: Secondary | ICD-10-CM | POA: Diagnosis not present

## 2019-06-21 DIAGNOSIS — G43009 Migraine without aura, not intractable, without status migrainosus: Secondary | ICD-10-CM | POA: Diagnosis not present

## 2019-06-21 DIAGNOSIS — H8112 Benign paroxysmal vertigo, left ear: Secondary | ICD-10-CM | POA: Diagnosis not present

## 2019-07-05 DIAGNOSIS — G43009 Migraine without aura, not intractable, without status migrainosus: Secondary | ICD-10-CM | POA: Diagnosis not present

## 2019-07-26 DIAGNOSIS — G43909 Migraine, unspecified, not intractable, without status migrainosus: Secondary | ICD-10-CM | POA: Diagnosis not present

## 2019-08-09 DIAGNOSIS — F419 Anxiety disorder, unspecified: Secondary | ICD-10-CM | POA: Diagnosis not present

## 2019-08-09 DIAGNOSIS — G43109 Migraine with aura, not intractable, without status migrainosus: Secondary | ICD-10-CM | POA: Diagnosis not present

## 2019-08-09 DIAGNOSIS — E6609 Other obesity due to excess calories: Secondary | ICD-10-CM | POA: Diagnosis not present

## 2019-08-09 DIAGNOSIS — G894 Chronic pain syndrome: Secondary | ICD-10-CM | POA: Diagnosis not present

## 2019-08-09 DIAGNOSIS — G4709 Other insomnia: Secondary | ICD-10-CM | POA: Diagnosis not present

## 2019-08-09 DIAGNOSIS — Z6834 Body mass index (BMI) 34.0-34.9, adult: Secondary | ICD-10-CM | POA: Diagnosis not present

## 2019-08-24 DIAGNOSIS — R110 Nausea: Secondary | ICD-10-CM | POA: Diagnosis not present

## 2019-08-24 DIAGNOSIS — G43909 Migraine, unspecified, not intractable, without status migrainosus: Secondary | ICD-10-CM | POA: Diagnosis not present

## 2019-09-12 DIAGNOSIS — Z6830 Body mass index (BMI) 30.0-30.9, adult: Secondary | ICD-10-CM | POA: Diagnosis not present

## 2019-09-12 DIAGNOSIS — E6609 Other obesity due to excess calories: Secondary | ICD-10-CM | POA: Diagnosis not present

## 2019-09-12 DIAGNOSIS — S39012A Strain of muscle, fascia and tendon of lower back, initial encounter: Secondary | ICD-10-CM | POA: Diagnosis not present

## 2019-09-12 DIAGNOSIS — G43109 Migraine with aura, not intractable, without status migrainosus: Secondary | ICD-10-CM | POA: Diagnosis not present

## 2019-10-03 DIAGNOSIS — G43109 Migraine with aura, not intractable, without status migrainosus: Secondary | ICD-10-CM | POA: Diagnosis not present

## 2019-10-24 DIAGNOSIS — J410 Simple chronic bronchitis: Secondary | ICD-10-CM | POA: Diagnosis not present

## 2019-10-24 DIAGNOSIS — K219 Gastro-esophageal reflux disease without esophagitis: Secondary | ICD-10-CM | POA: Diagnosis not present

## 2019-10-24 DIAGNOSIS — G43009 Migraine without aura, not intractable, without status migrainosus: Secondary | ICD-10-CM | POA: Diagnosis not present

## 2019-10-24 DIAGNOSIS — I700 Atherosclerosis of aorta: Secondary | ICD-10-CM | POA: Diagnosis not present

## 2019-10-24 DIAGNOSIS — Z6833 Body mass index (BMI) 33.0-33.9, adult: Secondary | ICD-10-CM | POA: Diagnosis not present

## 2019-11-03 DIAGNOSIS — G43109 Migraine with aura, not intractable, without status migrainosus: Secondary | ICD-10-CM | POA: Diagnosis not present

## 2019-11-28 DIAGNOSIS — G43909 Migraine, unspecified, not intractable, without status migrainosus: Secondary | ICD-10-CM | POA: Diagnosis not present

## 2019-11-28 DIAGNOSIS — Z6833 Body mass index (BMI) 33.0-33.9, adult: Secondary | ICD-10-CM | POA: Diagnosis not present

## 2019-11-28 DIAGNOSIS — E6609 Other obesity due to excess calories: Secondary | ICD-10-CM | POA: Diagnosis not present

## 2019-11-28 DIAGNOSIS — M791 Myalgia, unspecified site: Secondary | ICD-10-CM | POA: Diagnosis not present

## 2019-12-15 DIAGNOSIS — G43109 Migraine with aura, not intractable, without status migrainosus: Secondary | ICD-10-CM | POA: Diagnosis not present

## 2020-01-04 DIAGNOSIS — Z0001 Encounter for general adult medical examination with abnormal findings: Secondary | ICD-10-CM | POA: Diagnosis not present

## 2020-01-04 DIAGNOSIS — R7309 Other abnormal glucose: Secondary | ICD-10-CM | POA: Diagnosis not present

## 2020-01-04 DIAGNOSIS — Z6834 Body mass index (BMI) 34.0-34.9, adult: Secondary | ICD-10-CM | POA: Diagnosis not present

## 2020-01-04 DIAGNOSIS — E782 Mixed hyperlipidemia: Secondary | ICD-10-CM | POA: Diagnosis not present

## 2020-01-04 DIAGNOSIS — G894 Chronic pain syndrome: Secondary | ICD-10-CM | POA: Diagnosis not present

## 2020-01-04 DIAGNOSIS — Z0000 Encounter for general adult medical examination without abnormal findings: Secondary | ICD-10-CM | POA: Diagnosis not present

## 2020-01-04 DIAGNOSIS — G43109 Migraine with aura, not intractable, without status migrainosus: Secondary | ICD-10-CM | POA: Diagnosis not present

## 2020-01-04 DIAGNOSIS — K219 Gastro-esophageal reflux disease without esophagitis: Secondary | ICD-10-CM | POA: Diagnosis not present

## 2020-01-04 DIAGNOSIS — J302 Other seasonal allergic rhinitis: Secondary | ICD-10-CM | POA: Diagnosis not present

## 2020-01-17 DIAGNOSIS — G43109 Migraine with aura, not intractable, without status migrainosus: Secondary | ICD-10-CM | POA: Diagnosis not present

## 2020-03-21 DIAGNOSIS — G43109 Migraine with aura, not intractable, without status migrainosus: Secondary | ICD-10-CM | POA: Diagnosis not present

## 2020-04-12 DIAGNOSIS — M353 Polymyalgia rheumatica: Secondary | ICD-10-CM | POA: Diagnosis not present

## 2020-04-12 DIAGNOSIS — G43709 Chronic migraine without aura, not intractable, without status migrainosus: Secondary | ICD-10-CM | POA: Diagnosis not present

## 2020-04-12 DIAGNOSIS — E6609 Other obesity due to excess calories: Secondary | ICD-10-CM | POA: Diagnosis not present

## 2020-04-12 DIAGNOSIS — Z6834 Body mass index (BMI) 34.0-34.9, adult: Secondary | ICD-10-CM | POA: Diagnosis not present

## 2020-04-12 DIAGNOSIS — R110 Nausea: Secondary | ICD-10-CM | POA: Diagnosis not present

## 2020-05-01 DIAGNOSIS — J449 Chronic obstructive pulmonary disease, unspecified: Secondary | ICD-10-CM | POA: Diagnosis not present

## 2020-05-01 DIAGNOSIS — F17290 Nicotine dependence, other tobacco product, uncomplicated: Secondary | ICD-10-CM | POA: Diagnosis not present

## 2020-05-01 DIAGNOSIS — Z6834 Body mass index (BMI) 34.0-34.9, adult: Secondary | ICD-10-CM | POA: Diagnosis not present

## 2020-05-01 DIAGNOSIS — G894 Chronic pain syndrome: Secondary | ICD-10-CM | POA: Diagnosis not present

## 2020-05-01 DIAGNOSIS — G43009 Migraine without aura, not intractable, without status migrainosus: Secondary | ICD-10-CM | POA: Diagnosis not present

## 2020-05-30 DIAGNOSIS — G43909 Migraine, unspecified, not intractable, without status migrainosus: Secondary | ICD-10-CM | POA: Diagnosis not present

## 2020-06-25 DIAGNOSIS — Z6834 Body mass index (BMI) 34.0-34.9, adult: Secondary | ICD-10-CM | POA: Diagnosis not present

## 2020-06-25 DIAGNOSIS — G43109 Migraine with aura, not intractable, without status migrainosus: Secondary | ICD-10-CM | POA: Diagnosis not present

## 2020-06-25 DIAGNOSIS — E6609 Other obesity due to excess calories: Secondary | ICD-10-CM | POA: Diagnosis not present

## 2020-07-24 DIAGNOSIS — G43909 Migraine, unspecified, not intractable, without status migrainosus: Secondary | ICD-10-CM | POA: Diagnosis not present

## 2020-08-21 DIAGNOSIS — G43909 Migraine, unspecified, not intractable, without status migrainosus: Secondary | ICD-10-CM | POA: Diagnosis not present

## 2020-09-12 DIAGNOSIS — G43109 Migraine with aura, not intractable, without status migrainosus: Secondary | ICD-10-CM | POA: Diagnosis not present

## 2020-09-12 DIAGNOSIS — J45909 Unspecified asthma, uncomplicated: Secondary | ICD-10-CM | POA: Diagnosis not present

## 2020-09-12 DIAGNOSIS — G894 Chronic pain syndrome: Secondary | ICD-10-CM | POA: Diagnosis not present

## 2020-09-12 DIAGNOSIS — Z6834 Body mass index (BMI) 34.0-34.9, adult: Secondary | ICD-10-CM | POA: Diagnosis not present

## 2020-09-20 DIAGNOSIS — K1230 Oral mucositis (ulcerative), unspecified: Secondary | ICD-10-CM | POA: Diagnosis not present

## 2020-09-20 DIAGNOSIS — G2581 Restless legs syndrome: Secondary | ICD-10-CM | POA: Diagnosis not present

## 2020-09-20 DIAGNOSIS — F419 Anxiety disorder, unspecified: Secondary | ICD-10-CM | POA: Diagnosis not present

## 2020-10-08 DIAGNOSIS — G43109 Migraine with aura, not intractable, without status migrainosus: Secondary | ICD-10-CM | POA: Diagnosis not present

## 2020-10-08 DIAGNOSIS — E6609 Other obesity due to excess calories: Secondary | ICD-10-CM | POA: Diagnosis not present

## 2020-10-08 DIAGNOSIS — Z6834 Body mass index (BMI) 34.0-34.9, adult: Secondary | ICD-10-CM | POA: Diagnosis not present

## 2020-10-25 DIAGNOSIS — G43009 Migraine without aura, not intractable, without status migrainosus: Secondary | ICD-10-CM | POA: Diagnosis not present

## 2020-11-08 ENCOUNTER — Other Ambulatory Visit (HOSPITAL_COMMUNITY): Payer: Self-pay | Admitting: Physician Assistant

## 2020-11-08 DIAGNOSIS — Z1231 Encounter for screening mammogram for malignant neoplasm of breast: Secondary | ICD-10-CM

## 2020-11-08 DIAGNOSIS — E6609 Other obesity due to excess calories: Secondary | ICD-10-CM | POA: Diagnosis not present

## 2020-11-08 DIAGNOSIS — Z6834 Body mass index (BMI) 34.0-34.9, adult: Secondary | ICD-10-CM | POA: Diagnosis not present

## 2020-11-08 DIAGNOSIS — G43109 Migraine with aura, not intractable, without status migrainosus: Secondary | ICD-10-CM | POA: Diagnosis not present

## 2020-11-21 DIAGNOSIS — Z681 Body mass index (BMI) 19 or less, adult: Secondary | ICD-10-CM | POA: Diagnosis not present

## 2020-11-21 DIAGNOSIS — R110 Nausea: Secondary | ICD-10-CM | POA: Diagnosis not present

## 2020-11-21 DIAGNOSIS — G43109 Migraine with aura, not intractable, without status migrainosus: Secondary | ICD-10-CM | POA: Diagnosis not present

## 2020-11-21 DIAGNOSIS — J22 Unspecified acute lower respiratory infection: Secondary | ICD-10-CM | POA: Diagnosis not present

## 2020-11-26 ENCOUNTER — Ambulatory Visit (HOSPITAL_COMMUNITY): Payer: Self-pay

## 2020-11-26 ENCOUNTER — Encounter (HOSPITAL_COMMUNITY): Payer: Self-pay

## 2020-12-06 ENCOUNTER — Ambulatory Visit (HOSPITAL_COMMUNITY)
Admission: RE | Admit: 2020-12-06 | Discharge: 2020-12-06 | Disposition: A | Discharge status: Home / Self Care | Payer: Medicare Other | Source: Ambulatory Visit | Attending: Physician Assistant | Admitting: Physician Assistant

## 2020-12-06 ENCOUNTER — Other Ambulatory Visit: Payer: Self-pay

## 2020-12-06 DIAGNOSIS — Z1231 Encounter for screening mammogram for malignant neoplasm of breast: Secondary | ICD-10-CM | POA: Insufficient documentation

## 2020-12-19 DIAGNOSIS — G43109 Migraine with aura, not intractable, without status migrainosus: Secondary | ICD-10-CM | POA: Diagnosis not present

## 2021-01-29 DIAGNOSIS — G43909 Migraine, unspecified, not intractable, without status migrainosus: Secondary | ICD-10-CM | POA: Diagnosis not present

## 2021-03-19 DIAGNOSIS — G894 Chronic pain syndrome: Secondary | ICD-10-CM | POA: Diagnosis not present

## 2021-03-19 DIAGNOSIS — E6609 Other obesity due to excess calories: Secondary | ICD-10-CM | POA: Diagnosis not present

## 2021-03-19 DIAGNOSIS — Z6835 Body mass index (BMI) 35.0-35.9, adult: Secondary | ICD-10-CM | POA: Diagnosis not present

## 2021-03-19 DIAGNOSIS — G43109 Migraine with aura, not intractable, without status migrainosus: Secondary | ICD-10-CM | POA: Diagnosis not present

## 2021-04-15 DIAGNOSIS — G43909 Migraine, unspecified, not intractable, without status migrainosus: Secondary | ICD-10-CM | POA: Diagnosis not present

## 2021-05-07 DIAGNOSIS — G43909 Migraine, unspecified, not intractable, without status migrainosus: Secondary | ICD-10-CM | POA: Diagnosis not present

## 2021-05-07 DIAGNOSIS — Z6835 Body mass index (BMI) 35.0-35.9, adult: Secondary | ICD-10-CM | POA: Diagnosis not present

## 2021-05-07 DIAGNOSIS — E6609 Other obesity due to excess calories: Secondary | ICD-10-CM | POA: Diagnosis not present

## 2021-05-23 DIAGNOSIS — G43109 Migraine with aura, not intractable, without status migrainosus: Secondary | ICD-10-CM | POA: Diagnosis not present

## 2021-06-04 DIAGNOSIS — J449 Chronic obstructive pulmonary disease, unspecified: Secondary | ICD-10-CM | POA: Diagnosis not present

## 2021-06-04 DIAGNOSIS — Z6835 Body mass index (BMI) 35.0-35.9, adult: Secondary | ICD-10-CM | POA: Diagnosis not present

## 2021-06-04 DIAGNOSIS — G43109 Migraine with aura, not intractable, without status migrainosus: Secondary | ICD-10-CM | POA: Diagnosis not present

## 2021-06-04 DIAGNOSIS — E6609 Other obesity due to excess calories: Secondary | ICD-10-CM | POA: Diagnosis not present

## 2021-06-24 DIAGNOSIS — E6609 Other obesity due to excess calories: Secondary | ICD-10-CM | POA: Diagnosis not present

## 2021-06-24 DIAGNOSIS — G43109 Migraine with aura, not intractable, without status migrainosus: Secondary | ICD-10-CM | POA: Diagnosis not present

## 2021-06-24 DIAGNOSIS — Z6835 Body mass index (BMI) 35.0-35.9, adult: Secondary | ICD-10-CM | POA: Diagnosis not present

## 2021-06-24 DIAGNOSIS — R110 Nausea: Secondary | ICD-10-CM | POA: Diagnosis not present

## 2021-07-11 DIAGNOSIS — G43109 Migraine with aura, not intractable, without status migrainosus: Secondary | ICD-10-CM | POA: Diagnosis not present

## 2021-07-11 DIAGNOSIS — R110 Nausea: Secondary | ICD-10-CM | POA: Diagnosis not present

## 2021-07-17 DIAGNOSIS — J22 Unspecified acute lower respiratory infection: Secondary | ICD-10-CM | POA: Diagnosis not present

## 2021-07-17 DIAGNOSIS — J101 Influenza due to other identified influenza virus with other respiratory manifestations: Secondary | ICD-10-CM | POA: Diagnosis not present

## 2021-07-24 DIAGNOSIS — R109 Unspecified abdominal pain: Secondary | ICD-10-CM | POA: Diagnosis not present

## 2021-07-24 DIAGNOSIS — G43109 Migraine with aura, not intractable, without status migrainosus: Secondary | ICD-10-CM | POA: Diagnosis not present

## 2021-07-24 DIAGNOSIS — R110 Nausea: Secondary | ICD-10-CM | POA: Diagnosis not present

## 2021-07-24 DIAGNOSIS — R1110 Vomiting, unspecified: Secondary | ICD-10-CM | POA: Diagnosis not present

## 2021-08-08 DIAGNOSIS — H5203 Hypermetropia, bilateral: Secondary | ICD-10-CM | POA: Diagnosis not present

## 2021-08-20 DIAGNOSIS — R110 Nausea: Secondary | ICD-10-CM | POA: Diagnosis not present

## 2021-08-20 DIAGNOSIS — Z6833 Body mass index (BMI) 33.0-33.9, adult: Secondary | ICD-10-CM | POA: Diagnosis not present

## 2021-08-20 DIAGNOSIS — G43109 Migraine with aura, not intractable, without status migrainosus: Secondary | ICD-10-CM | POA: Diagnosis not present

## 2021-08-20 DIAGNOSIS — G4709 Other insomnia: Secondary | ICD-10-CM | POA: Diagnosis not present

## 2021-08-21 DIAGNOSIS — H5203 Hypermetropia, bilateral: Secondary | ICD-10-CM | POA: Diagnosis not present

## 2021-08-25 HISTORY — PX: BREAST CYST EXCISION: SHX579

## 2021-08-27 ENCOUNTER — Other Ambulatory Visit (HOSPITAL_COMMUNITY): Payer: Self-pay | Admitting: Physician Assistant

## 2021-08-27 DIAGNOSIS — R0781 Pleurodynia: Secondary | ICD-10-CM

## 2021-08-28 ENCOUNTER — Other Ambulatory Visit: Payer: Self-pay

## 2021-08-28 ENCOUNTER — Ambulatory Visit (HOSPITAL_COMMUNITY)
Admission: RE | Admit: 2021-08-28 | Discharge: 2021-08-28 | Disposition: A | Discharge status: Home / Self Care | Payer: Medicare Other | Source: Ambulatory Visit | Attending: Physician Assistant | Admitting: Physician Assistant

## 2021-08-28 DIAGNOSIS — S2241XA Multiple fractures of ribs, right side, initial encounter for closed fracture: Secondary | ICD-10-CM | POA: Diagnosis not present

## 2021-08-28 DIAGNOSIS — R0781 Pleurodynia: Secondary | ICD-10-CM | POA: Insufficient documentation

## 2021-09-10 DIAGNOSIS — G43109 Migraine with aura, not intractable, without status migrainosus: Secondary | ICD-10-CM | POA: Diagnosis not present

## 2021-09-10 DIAGNOSIS — G894 Chronic pain syndrome: Secondary | ICD-10-CM | POA: Diagnosis not present

## 2021-09-10 DIAGNOSIS — S2239XA Fracture of one rib, unspecified side, initial encounter for closed fracture: Secondary | ICD-10-CM | POA: Diagnosis not present

## 2021-09-10 DIAGNOSIS — R0781 Pleurodynia: Secondary | ICD-10-CM | POA: Diagnosis not present

## 2021-10-14 DIAGNOSIS — G4709 Other insomnia: Secondary | ICD-10-CM | POA: Diagnosis not present

## 2021-10-14 DIAGNOSIS — Z6833 Body mass index (BMI) 33.0-33.9, adult: Secondary | ICD-10-CM | POA: Diagnosis not present

## 2021-10-14 DIAGNOSIS — M25552 Pain in left hip: Secondary | ICD-10-CM | POA: Diagnosis not present

## 2021-10-14 DIAGNOSIS — G43109 Migraine with aura, not intractable, without status migrainosus: Secondary | ICD-10-CM | POA: Diagnosis not present

## 2021-10-21 ENCOUNTER — Other Ambulatory Visit: Payer: Self-pay

## 2021-10-21 ENCOUNTER — Other Ambulatory Visit (HOSPITAL_COMMUNITY): Payer: Self-pay | Admitting: Physician Assistant

## 2021-10-21 ENCOUNTER — Ambulatory Visit (HOSPITAL_COMMUNITY)
Admission: RE | Admit: 2021-10-21 | Discharge: 2021-10-21 | Disposition: A | Discharge status: Home / Self Care | Payer: Medicare Other | Source: Ambulatory Visit | Attending: Physician Assistant | Admitting: Physician Assistant

## 2021-10-21 DIAGNOSIS — M25552 Pain in left hip: Secondary | ICD-10-CM | POA: Insufficient documentation

## 2021-10-22 DIAGNOSIS — G43109 Migraine with aura, not intractable, without status migrainosus: Secondary | ICD-10-CM | POA: Diagnosis not present

## 2021-10-22 DIAGNOSIS — Z6834 Body mass index (BMI) 34.0-34.9, adult: Secondary | ICD-10-CM | POA: Diagnosis not present

## 2021-10-22 DIAGNOSIS — Z1331 Encounter for screening for depression: Secondary | ICD-10-CM | POA: Diagnosis not present

## 2021-10-22 DIAGNOSIS — N61 Mastitis without abscess: Secondary | ICD-10-CM | POA: Diagnosis not present

## 2021-10-24 ENCOUNTER — Ambulatory Visit: Payer: Medicare Other | Admitting: General Surgery

## 2021-10-24 ENCOUNTER — Other Ambulatory Visit: Payer: Self-pay | Admitting: *Deleted

## 2021-10-24 ENCOUNTER — Encounter: Payer: Self-pay | Admitting: General Surgery

## 2021-10-24 ENCOUNTER — Other Ambulatory Visit: Payer: Self-pay

## 2021-10-24 VITALS — BP 133/85 | HR 83 | Temp 98.8°F | Resp 16 | Ht 61.0 in | Wt 187.0 lb

## 2021-10-24 DIAGNOSIS — N611 Abscess of the breast and nipple: Secondary | ICD-10-CM | POA: Diagnosis not present

## 2021-10-24 DIAGNOSIS — L03818 Cellulitis of other sites: Secondary | ICD-10-CM

## 2021-10-24 NOTE — Patient Instructions (Signed)
Continue antibiotic ?Will see you back and make sure improving  ? ?

## 2021-10-24 NOTE — Progress Notes (Signed)
[facility] History and Physical ? ?Reason for Referral: Right breast abscess  ?Referring Physician: Redmond School, MD ? ? ?Chief Complaint   ?New Patient (Initial Visit) ?  ? ? ?Danielle KALEEYA Montoya is a 56 y.o. female.  ?HPI:  Ms. Danielle Montoya is a 56 yo who had a normal mammogram in 2022 and has had some swelling and drainage of her right breast on the inferior portion starting over the weekend. The pain has increased and the swelling increased. She saw her PCP and was placed on antibiotics and the area opened spontaneously and has been draining. She has had prior abscess in the past she reports.  ? ?The area is feeling better now that it drained and the tenderness is improving. The area continues to drain a small amount.  ? ?Past Medical History:  ?Diagnosis Date  ? Anxiety   ? Arthritis   ? right knee and left arm  ? Depression   ? GERD (gastroesophageal reflux disease)   ? Headache(784.0)   ? Migraines  ? Seasonal allergies   ? Torn muscle   ? Left arm  ? ? ?Past Surgical History:  ?Procedure Laterality Date  ? ABDOMINAL HYSTERECTOMY    ? arm surgery    ? CHOLECYSTECTOMY    ? dysfunctional  ? COLONOSCOPY  06/10/2012  ? Procedure: COLONOSCOPY;  Surgeon: Rogene Houston, MD;  [facility];  Service: Endoscopy;  Laterality: N/A;  1200  ? MICROLARYNGOSCOPY Right 09/15/2016  ? Procedure: RIGHT VOCAL CORD MICRO DIRECT LARYNGOSCOPY WITH BIOPSY;  Surgeon: Leta Baptist, MD;  Location: [place];  Service: ENT;  Laterality: Right;  ? TONSILLECTOMY    ? ? ?Family History  ?Problem Relation Age of Onset  ? Colon cancer Mother   ? ? ?Social History  ? ?Tobacco Use  ? Smoking status: Every Day  ?  Packs/day: 1.00  ?  Years: 32.00  ?  Pack years: 32.00  ?  Types: Cigarettes  ? Smokeless tobacco: Never  ? Tobacco comments:  ?  1/2 pack a day. Smoking since age 70.  ?Substance Use Topics  ? Alcohol use: No  ? Drug use: No  ? ? ?Medications: I have reviewed the patient's current  medications. ?Allergies as of 10/24/2021   ? ?   Reactions  ? Penicillins Anaphylaxis, Nausea And Vomiting, Rash  ? Prednisone Itching  ? Shellfish Allergy Nausea And Vomiting  ? All seafood causes patient to be extremely sick.  ? Aspirin Nausea And Vomiting, Rash  ? Erythromycin Nausea And Vomiting, Rash  ? ?  ? ?  ?Medication List  ?  ? ?  ? Accurate as of October 24, 2021  3:16 PM. If you have any questions, ask your nurse or doctor.  ?  ?  ? ?  ? ?STOP taking these medications   ? ?chlorpheniramine-HYDROcodone 10-8 MG/5ML Lqcr ?Commonly known as: Tussionex Pennkinetic ER ?Stopped by: Virl Cagey, MD ?  ?fluticasone 50 MCG/ACT nasal spray ?Commonly known as: FLONASE ?Stopped by: Virl Cagey, MD ?  ?guaiFENesin 100 MG/5ML Soln ?Commonly known as: ROBITUSSIN ?Stopped by: Virl Cagey, MD ?  ?ranitidine 150 MG tablet ?Commonly known as: ZANTAC ?Stopped by: Virl Cagey, MD ?  ?tiZANidine 4 MG capsule ?Commonly known as: ZANAFLEX ?Stopped by: Virl Cagey, MD ?  ?topiramate 25 MG tablet ?Commonly known as: TOPAMAX ?Stopped by: Virl Cagey, MD ?  ?traMADol 50 MG tablet ?Commonly known as: ULTRAM ?Stopped by:  Virl Cagey, MD ?  ? ?  ? ?TAKE these medications   ? ?albuterol 0.63 MG/3ML nebulizer solution ?Commonly known as: ACCUNEB ?Take 1 ampule by nebulization every 6 (six) hours as needed for wheezing. ?  ?albuterol 0.63 MG/3ML nebulizer solution ?Commonly known as: ACCUNEB ?Take 1 ampule by nebulization every 6 (six) hours as needed for wheezing. ?  ?albuterol 4 MG tablet ?Commonly known as: PROVENTIL ?Take 4 mg by mouth 4 (four) times daily as needed for wheezing. ?  ?butalbital-acetaminophen-caffeine 50-325-40 MG tablet ?Commonly known as: FIORICET ?Take 1 tablet by mouth every 6 (six) hours as needed for headache or migraine. ?  ?cyclobenzaprine 10 MG tablet ?Commonly known as: FLEXERIL ?Take 10 mg by mouth 3 (three) times daily as needed. ?  ?ketorolac 10 MG tablet ?Commonly  known as: TORADOL ?Take by mouth. ?  ?LORazepam 1 MG tablet ?Commonly known as: ATIVAN ?Take 1 mg by mouth every 8 (eight) hours. ?  ?sulfamethoxazole-trimethoprim 800-160 MG tablet ?Commonly known as: BACTRIM DS ?Take 1 tablet by mouth 2 (two) times daily. ?  ?traZODone 100 MG tablet ?Commonly known as: DESYREL ?Take 100 mg by mouth at bedtime. ?  ? ?  ? ? ? ?ROS:  ?A comprehensive review of systems was negative except for: Gastrointestinal: positive for reflux symptoms ?Neurological: positive for headaches ? ?Blood pressure 133/85, pulse 83, temperature 98.8 ?F (37.1 ?C), temperature source Other (Comment), resp. rate 16, height 5\' 1"  (1.549 m), weight 187 lb (84.8 kg), SpO2 92 %. ?Physical Exam ?Vitals reviewed.  ?Constitutional:   ?   Appearance: Normal appearance.  ?HENT:  ?   Nose: Nose normal.  ?Eyes:  ?   Extraocular Movements: Extraocular movements intact.  ?Cardiovascular:  ?   Rate and Rhythm: Normal rate and regular rhythm.  ?Pulmonary:  ?   Effort: Pulmonary effort is normal.  ?Chest:  ?   Comments: Right breast inferior to areola with improving erythema and no induration, no fluctuant area, opening with drainage  ?Abdominal:  ?   General: There is no distension.  ?   Palpations: Abdomen is soft.  ?   Tenderness: There is no abdominal tenderness.  ?Musculoskeletal:     ?   General: Normal range of motion.  ?Skin: ?   General: Skin is warm.  ?Neurological:  ?   General: No focal deficit present.  ?   Mental Status: She is alert.  ?Psychiatric:     ?   Mood and Affect: Mood normal.  ? ? ?Results: ?CLINICAL DATA:  Screening. ?  ?EXAM: ?DIGITAL SCREENING BILATERAL MAMMOGRAM WITH TOMOSYNTHESIS AND CAD ?  ?TECHNIQUE: ?Bilateral screening digital craniocaudal and mediolateral oblique ?mammograms were obtained. Bilateral screening digital breast ?tomosynthesis was performed. The images were evaluated with ?computer-aided detection. ?  ?COMPARISON:  Previous exam(s). ?  ?ACR Breast Density Category b: There are  scattered areas of ?fibroglandular density. ?  ?FINDINGS: ?There are no findings suspicious for malignancy. The images were ?evaluated with computer-aided detection. ?  ?IMPRESSION: ?No mammographic evidence of malignancy. A result letter of this ?screening mammogram will be mailed directly to the patient. ?  ?RECOMMENDATION: ?Screening mammogram in one year. (Code:SM-B-01Y) ?  ?BI-RADS CATEGORY  1: Negative. ?  ?  ?Electronically Signed ?  By: Franki Cabot M.D. ?  On: 12/07/2020 09:31 ? ? ?Assessment & Plan:  ?TASHAWNA THOM is a 56 y.o. female with a right breast abscess that is improving after it drained and she is on antibiotics. No  further I&D needed today.  ?-Will reassess next week  ?-Will need to continue with her annual mammograms as long as this is improving, she will be due April 2023  ? ? ?Future Appointments  ?Date Time Provider [place]  ?10/28/2021 10:15 AM Virl Cagey, MD [facility] None  ? ? ?All questions were answered to the satisfaction of the patient.  ? ?Virl Cagey ?10/24/2021, 3:16 PM  ? ? ? ? ? ?

## 2021-10-28 ENCOUNTER — Ambulatory Visit: Payer: Medicare Other | Admitting: General Surgery

## 2021-10-28 ENCOUNTER — Encounter: Payer: Self-pay | Admitting: General Surgery

## 2021-10-28 ENCOUNTER — Other Ambulatory Visit: Payer: Self-pay

## 2021-10-28 VITALS — BP 148/85 | HR 76 | Temp 98.6°F | Resp 16 | Ht 61.0 in | Wt 187.0 lb

## 2021-10-28 DIAGNOSIS — N611 Abscess of the breast and nipple: Secondary | ICD-10-CM | POA: Diagnosis not present

## 2021-10-28 NOTE — Progress Notes (Signed)
[facility] ? ?Breast continues to drain some. Swelling improving. ? ?BP (!) 148/85   Pulse 76   Temp 98.6 ?F (37 ?C) (Oral)   Resp 16   Ht '5\' 1"'$  (1.549 m)   Wt 187 lb (84.8 kg)   SpO2 96%   BMI 35.33 kg/m?  ?Right breast cellulitis on the inferior breast, opening with  minor drainage, minimal induration, no fluctuance  ? ?Patient with right breast abscess. Resolving. No need for I&D.  ?Finish antibiotic. ?Continue to monitor. ?Call with changes. ?Mammogram in April 2023, call to get this scheduled.  ? ?Curlene Labrum, MD ?[facility] ?[place], [place] [address] ?[phone] (office) ? ?

## 2021-10-28 NOTE — Patient Instructions (Signed)
Finish antibiotic. ?Continue to monitor. ?Call with changes. ?Mammogram in April 2023, call to get this scheduled.  ?

## 2021-11-12 DIAGNOSIS — Z0001 Encounter for general adult medical examination with abnormal findings: Secondary | ICD-10-CM | POA: Diagnosis not present

## 2021-11-12 DIAGNOSIS — G43109 Migraine with aura, not intractable, without status migrainosus: Secondary | ICD-10-CM | POA: Diagnosis not present

## 2021-11-12 DIAGNOSIS — Z1331 Encounter for screening for depression: Secondary | ICD-10-CM | POA: Diagnosis not present

## 2021-11-12 DIAGNOSIS — Z6834 Body mass index (BMI) 34.0-34.9, adult: Secondary | ICD-10-CM | POA: Diagnosis not present

## 2021-12-02 DIAGNOSIS — G43109 Migraine with aura, not intractable, without status migrainosus: Secondary | ICD-10-CM | POA: Diagnosis not present

## 2021-12-02 DIAGNOSIS — R110 Nausea: Secondary | ICD-10-CM | POA: Diagnosis not present

## 2021-12-17 DIAGNOSIS — G43109 Migraine with aura, not intractable, without status migrainosus: Secondary | ICD-10-CM | POA: Diagnosis not present

## 2021-12-17 DIAGNOSIS — G894 Chronic pain syndrome: Secondary | ICD-10-CM | POA: Diagnosis not present

## 2021-12-17 DIAGNOSIS — M65311 Trigger thumb, right thumb: Secondary | ICD-10-CM | POA: Diagnosis not present

## 2021-12-17 DIAGNOSIS — E6609 Other obesity due to excess calories: Secondary | ICD-10-CM | POA: Diagnosis not present

## 2022-01-01 ENCOUNTER — Other Ambulatory Visit: Payer: Self-pay | Admitting: Internal Medicine

## 2022-01-01 ENCOUNTER — Other Ambulatory Visit (HOSPITAL_COMMUNITY): Payer: Self-pay | Admitting: Internal Medicine

## 2022-01-01 ENCOUNTER — Ambulatory Visit (HOSPITAL_COMMUNITY)
Admission: RE | Admit: 2022-01-01 | Discharge: 2022-01-01 | Disposition: A | Discharge status: Home / Self Care | Payer: Medicare Other | Source: Ambulatory Visit | Attending: Internal Medicine | Admitting: Internal Medicine

## 2022-01-01 DIAGNOSIS — R600 Localized edema: Secondary | ICD-10-CM | POA: Diagnosis not present

## 2022-01-01 DIAGNOSIS — Z6834 Body mass index (BMI) 34.0-34.9, adult: Secondary | ICD-10-CM | POA: Diagnosis not present

## 2022-01-01 DIAGNOSIS — R609 Edema, unspecified: Secondary | ICD-10-CM | POA: Diagnosis not present

## 2022-01-01 DIAGNOSIS — M79605 Pain in left leg: Secondary | ICD-10-CM | POA: Insufficient documentation

## 2022-01-01 DIAGNOSIS — I872 Venous insufficiency (chronic) (peripheral): Secondary | ICD-10-CM | POA: Diagnosis not present

## 2022-01-02 ENCOUNTER — Telehealth: Payer: Self-pay | Admitting: *Deleted

## 2022-01-02 ENCOUNTER — Other Ambulatory Visit (HOSPITAL_COMMUNITY): Payer: Self-pay | Admitting: Internal Medicine

## 2022-01-02 ENCOUNTER — Other Ambulatory Visit: Payer: Self-pay | Admitting: Internal Medicine

## 2022-01-02 DIAGNOSIS — R609 Edema, unspecified: Secondary | ICD-10-CM

## 2022-01-02 DIAGNOSIS — M79605 Pain in left leg: Secondary | ICD-10-CM

## 2022-01-02 MED ORDER — SULFAMETHOXAZOLE-TRIMETHOPRIM 800-160 MG PO TABS: 1.0000 | ORAL_TABLET | Freq: Two times a day (BID) | ORAL | 0 refills | Status: AC

## 2022-01-02 NOTE — Telephone Encounter (Signed)
Received call from patient [phone]~ telephone.  ? ?Reports that she was seen in March for right breast abscess. Noted at appointment on 10/28/2021, abscess had spontaneously opened and drained purulent fluids. Dr. Constance Haw recommended to continue ABTx from PCP, schedule mammogram after abscess resolved, and to follow up in office as needed.  ? ?Patient reports that she noted abscess to be returning earlier in week. States that she has hard lump approximately the size of a 50 cent piece in the same place on her right breast that appears to be moving closer to surface of skin. Reports that surrounding skin is noted red, and she has deep ache in the tissues of her breast.  ? ?Patient denies fever/ chills or any lymph node involvement.  ? ?Of note, patient was seen by PCP for left lower leg edema and pain. PCP ordered LLE [place] to R/O DVT. Patient did not discuss breast pain with PCP.  ? ?Discussed above with Dr. Constance Haw whose recommendations are as follows: ?Use warm compresses to area.  ?Bactrim DS 1 tab PO BID x10 days.  ?Follow up in office next week.  ?Schedule mammogram once area resolved.  ? ?Call placed to patient and patient made aware. Prescription sent to [facility]. Appointment scheduled.  ?

## 2022-01-08 ENCOUNTER — Encounter: Payer: Self-pay | Admitting: General Surgery

## 2022-01-08 ENCOUNTER — Ambulatory Visit: Payer: Medicare Other | Admitting: General Surgery

## 2022-01-08 VITALS — BP 122/79 | HR 84 | Temp 98.2°F | Resp 18 | Ht 61.0 in | Wt 188.6 lb

## 2022-01-08 DIAGNOSIS — N611 Abscess of the breast and nipple: Secondary | ICD-10-CM | POA: Diagnosis not present

## 2022-01-08 DIAGNOSIS — L720 Epidermal cyst: Secondary | ICD-10-CM | POA: Diagnosis not present

## 2022-01-08 DIAGNOSIS — L089 Local infection of the skin and subcutaneous tissue, unspecified: Secondary | ICD-10-CM | POA: Diagnosis not present

## 2022-01-08 MED ORDER — HYDROCODONE-ACETAMINOPHEN 5-325 MG PO TABS: 1.0000 | ORAL_TABLET | ORAL | 0 refills | Status: DC | PRN

## 2022-01-08 NOTE — Progress Notes (Signed)
[facility] Procedure Note  01/08/22  Pre-procedure Diagnosis: Recurrent right breast abscess    Post-procedure Diagnosis: Same   Procedure(s) Performed: Excision of breast abscess cavity    Surgeon: Lanell Matar. Constance Haw, MD   Assistants: No qualified resident was available    Anesthesia: Lidocaine 1%    Specimens: Right breast abscess    Estimated Blood Loss: Minimal  Wound Class: Contaminated   Procedure Indications: Ms. Grandmaison is a 56 yo with a recurrent abscess on the medial lower part of her right breast that has been coming back. This is better now after a round of antibiotics but she says she is worried about it coming back. There is nothing to drain or evacuate but she says she can feel the "knot" or "root".  She wants to see if we can remove this to prevent it from coming back. Discusses excision of the cavity and risk of bleeding, infection, finding something like cancer, needing more surgery, having to pack a wound.   Findings: Indurated area of involuted breast abscess cavity superficial at skin level    Procedure: The patient was taken to the procedure room and placed supine. The right breast was prepared and draped in the usual sterile fashion. Lidocaine 1% was injected.   An elliptical incision was made around the area of concern and carried down into the breast tissue and the entire indurated area of prior abscess cavity was excised. Healthy breast tissue remained. No drainage or purulence was noted. The cavity was made hemostatic. The breast incision was closed with 3-0 Nylon suture and covered with neosporin and a gauze/ tegaderm.   Final inspection revealed acceptable hemostasis. The patient tolerated the procedure well.   Keep bandage on for 48 hours. Ok to remove then. Replace with neosporin and bandaid.  Pain medication as needed. Monitor for any signs of infection. Take your antibiotic. Will plan for mammogram in about 6 weeks (had a normal  Mammogram in 2022)   Future Appointments  Date Time Provider [place]  01/21/2022 10:45 AM Virl Cagey, MD [facility] None     Curlene Labrum, MD [facility] [address] [place], [place] [address] [phone] (office)

## 2022-01-08 NOTE — Patient Instructions (Signed)
Keep bandage on for 48 hours. Ok to remove then. Replace with neosporin and bandaid.  ?Pain medication as needed. ?Monitor for any signs of infection. ?Take your antibiotic. ?Will plan for mammogram in about 6 weeks.  ? ?

## 2022-01-13 LAB — PATHOLOGY REPORT

## 2022-01-13 LAB — TISSUE SPECIMEN

## 2022-01-15 NOTE — Progress Notes (Signed)
Patient with looks like ruptured cyst that was removed. So no true breast abscess. Can you let her know.

## 2022-01-21 ENCOUNTER — Encounter: Payer: Self-pay | Admitting: General Surgery

## 2022-01-21 ENCOUNTER — Ambulatory Visit (INDEPENDENT_AMBULATORY_CARE_PROVIDER_SITE_OTHER): Payer: Medicare Other | Admitting: General Surgery

## 2022-01-21 VITALS — BP 130/79 | HR 69 | Temp 98.1°F | Resp 14 | Ht 61.0 in | Wt 192.0 lb

## 2022-01-21 DIAGNOSIS — Z6833 Body mass index (BMI) 33.0-33.9, adult: Secondary | ICD-10-CM | POA: Diagnosis not present

## 2022-01-21 DIAGNOSIS — G2581 Restless legs syndrome: Secondary | ICD-10-CM | POA: Diagnosis not present

## 2022-01-21 DIAGNOSIS — Z09 Encounter for follow-up examination after completed treatment for conditions other than malignant neoplasm: Secondary | ICD-10-CM

## 2022-01-21 DIAGNOSIS — F419 Anxiety disorder, unspecified: Secondary | ICD-10-CM | POA: Diagnosis not present

## 2022-01-21 DIAGNOSIS — J449 Chronic obstructive pulmonary disease, unspecified: Secondary | ICD-10-CM | POA: Diagnosis not present

## 2022-01-21 NOTE — Progress Notes (Signed)
Subjective:     Danielle Montoya  Patient here for follow-up wound care, suture removal.  Is status post cyst excision of the right breast.  She has been doing well and has no complaints. Objective:    BP 130/79   Pulse 69   Temp 98.1 F (36.7 C) (Oral)   Resp 14   Ht '5\' 1"'$  (1.549 m)   Wt 192 lb (87.1 kg)   SpO2 92%   BMI 36.28 kg/m   General:  alert, cooperative, and no distress  Right breast incision healing well.  Sutures removed, Steri-Strips applied.     Assessment:    Doing well postoperatively.    Plan:   Follow-up here as needed.  Patient pleased with results.

## 2022-02-27 DIAGNOSIS — J449 Chronic obstructive pulmonary disease, unspecified: Secondary | ICD-10-CM | POA: Diagnosis not present

## 2022-02-27 DIAGNOSIS — M353 Polymyalgia rheumatica: Secondary | ICD-10-CM | POA: Diagnosis not present

## 2022-02-27 DIAGNOSIS — F419 Anxiety disorder, unspecified: Secondary | ICD-10-CM | POA: Diagnosis not present

## 2022-02-27 DIAGNOSIS — G43109 Migraine with aura, not intractable, without status migrainosus: Secondary | ICD-10-CM | POA: Diagnosis not present

## 2022-03-05 DIAGNOSIS — G43109 Migraine with aura, not intractable, without status migrainosus: Secondary | ICD-10-CM | POA: Diagnosis not present

## 2022-03-31 DIAGNOSIS — M353 Polymyalgia rheumatica: Secondary | ICD-10-CM | POA: Diagnosis not present

## 2022-03-31 DIAGNOSIS — J449 Chronic obstructive pulmonary disease, unspecified: Secondary | ICD-10-CM | POA: Diagnosis not present

## 2022-03-31 DIAGNOSIS — F419 Anxiety disorder, unspecified: Secondary | ICD-10-CM | POA: Diagnosis not present

## 2022-03-31 DIAGNOSIS — G43109 Migraine with aura, not intractable, without status migrainosus: Secondary | ICD-10-CM | POA: Diagnosis not present

## 2022-04-22 DIAGNOSIS — G43109 Migraine with aura, not intractable, without status migrainosus: Secondary | ICD-10-CM | POA: Diagnosis not present

## 2022-04-22 DIAGNOSIS — F419 Anxiety disorder, unspecified: Secondary | ICD-10-CM | POA: Diagnosis not present

## 2022-04-22 DIAGNOSIS — J449 Chronic obstructive pulmonary disease, unspecified: Secondary | ICD-10-CM | POA: Diagnosis not present

## 2022-04-22 DIAGNOSIS — M353 Polymyalgia rheumatica: Secondary | ICD-10-CM | POA: Diagnosis not present

## 2022-05-13 DIAGNOSIS — J449 Chronic obstructive pulmonary disease, unspecified: Secondary | ICD-10-CM | POA: Diagnosis not present

## 2022-05-13 DIAGNOSIS — F419 Anxiety disorder, unspecified: Secondary | ICD-10-CM | POA: Diagnosis not present

## 2022-05-13 DIAGNOSIS — G894 Chronic pain syndrome: Secondary | ICD-10-CM | POA: Diagnosis not present

## 2022-05-13 DIAGNOSIS — G43011 Migraine without aura, intractable, with status migrainosus: Secondary | ICD-10-CM | POA: Diagnosis not present

## 2022-06-10 DIAGNOSIS — G43011 Migraine without aura, intractable, with status migrainosus: Secondary | ICD-10-CM | POA: Diagnosis not present

## 2022-07-08 DIAGNOSIS — G43011 Migraine without aura, intractable, with status migrainosus: Secondary | ICD-10-CM | POA: Diagnosis not present

## 2022-08-11 DIAGNOSIS — H5203 Hypermetropia, bilateral: Secondary | ICD-10-CM | POA: Diagnosis not present

## 2022-08-20 DIAGNOSIS — G43011 Migraine without aura, intractable, with status migrainosus: Secondary | ICD-10-CM | POA: Diagnosis not present

## 2022-08-20 DIAGNOSIS — H524 Presbyopia: Secondary | ICD-10-CM | POA: Diagnosis not present

## 2022-09-02 DIAGNOSIS — J42 Unspecified chronic bronchitis: Secondary | ICD-10-CM | POA: Diagnosis not present

## 2022-09-02 DIAGNOSIS — J9801 Acute bronchospasm: Secondary | ICD-10-CM | POA: Diagnosis not present

## 2022-09-02 DIAGNOSIS — G894 Chronic pain syndrome: Secondary | ICD-10-CM | POA: Diagnosis not present

## 2022-09-02 DIAGNOSIS — F419 Anxiety disorder, unspecified: Secondary | ICD-10-CM | POA: Diagnosis not present

## 2022-09-02 DIAGNOSIS — Z6833 Body mass index (BMI) 33.0-33.9, adult: Secondary | ICD-10-CM | POA: Diagnosis not present

## 2022-09-02 DIAGNOSIS — E6609 Other obesity due to excess calories: Secondary | ICD-10-CM | POA: Diagnosis not present

## 2022-09-02 DIAGNOSIS — M353 Polymyalgia rheumatica: Secondary | ICD-10-CM | POA: Diagnosis not present

## 2022-09-02 DIAGNOSIS — G43109 Migraine with aura, not intractable, without status migrainosus: Secondary | ICD-10-CM | POA: Diagnosis not present

## 2022-09-02 DIAGNOSIS — J449 Chronic obstructive pulmonary disease, unspecified: Secondary | ICD-10-CM | POA: Diagnosis not present

## 2022-09-10 ENCOUNTER — Other Ambulatory Visit (HOSPITAL_COMMUNITY): Payer: Self-pay | Admitting: Internal Medicine

## 2022-09-10 ENCOUNTER — Ambulatory Visit (HOSPITAL_COMMUNITY)
Admission: RE | Admit: 2022-09-10 | Discharge: 2022-09-10 | Disposition: A | Discharge status: Home / Self Care | Payer: Medicare Other | Source: Ambulatory Visit | Attending: Internal Medicine | Admitting: Internal Medicine

## 2022-09-10 DIAGNOSIS — R051 Acute cough: Secondary | ICD-10-CM | POA: Diagnosis not present

## 2022-09-10 DIAGNOSIS — R059 Cough, unspecified: Secondary | ICD-10-CM | POA: Diagnosis not present

## 2022-09-12 DIAGNOSIS — R051 Acute cough: Secondary | ICD-10-CM | POA: Diagnosis not present

## 2022-09-12 DIAGNOSIS — Z6833 Body mass index (BMI) 33.0-33.9, adult: Secondary | ICD-10-CM | POA: Diagnosis not present

## 2022-09-12 DIAGNOSIS — G894 Chronic pain syndrome: Secondary | ICD-10-CM | POA: Diagnosis not present

## 2022-09-12 DIAGNOSIS — J9801 Acute bronchospasm: Secondary | ICD-10-CM | POA: Diagnosis not present

## 2022-09-12 DIAGNOSIS — E6609 Other obesity due to excess calories: Secondary | ICD-10-CM | POA: Diagnosis not present

## 2022-09-12 DIAGNOSIS — B370 Candidal stomatitis: Secondary | ICD-10-CM | POA: Diagnosis not present

## 2022-09-12 DIAGNOSIS — J449 Chronic obstructive pulmonary disease, unspecified: Secondary | ICD-10-CM | POA: Diagnosis not present

## 2022-09-12 DIAGNOSIS — G43109 Migraine with aura, not intractable, without status migrainosus: Secondary | ICD-10-CM | POA: Diagnosis not present

## 2022-10-06 DIAGNOSIS — G43011 Migraine without aura, intractable, with status migrainosus: Secondary | ICD-10-CM | POA: Diagnosis not present

## 2022-10-28 DIAGNOSIS — G43011 Migraine without aura, intractable, with status migrainosus: Secondary | ICD-10-CM | POA: Diagnosis not present

## 2022-11-23 DIAGNOSIS — J449 Chronic obstructive pulmonary disease, unspecified: Secondary | ICD-10-CM | POA: Diagnosis not present

## 2022-11-23 DIAGNOSIS — G43109 Migraine with aura, not intractable, without status migrainosus: Secondary | ICD-10-CM | POA: Diagnosis not present

## 2022-11-25 DIAGNOSIS — I872 Venous insufficiency (chronic) (peripheral): Secondary | ICD-10-CM | POA: Diagnosis not present

## 2022-11-25 DIAGNOSIS — G43011 Migraine without aura, intractable, with status migrainosus: Secondary | ICD-10-CM | POA: Diagnosis not present

## 2022-11-25 DIAGNOSIS — Z1331 Encounter for screening for depression: Secondary | ICD-10-CM | POA: Diagnosis not present

## 2022-11-25 DIAGNOSIS — E6609 Other obesity due to excess calories: Secondary | ICD-10-CM | POA: Diagnosis not present

## 2022-11-25 DIAGNOSIS — Z0001 Encounter for general adult medical examination with abnormal findings: Secondary | ICD-10-CM | POA: Diagnosis not present

## 2022-11-25 DIAGNOSIS — G43109 Migraine with aura, not intractable, without status migrainosus: Secondary | ICD-10-CM | POA: Diagnosis not present

## 2022-11-25 DIAGNOSIS — Z6834 Body mass index (BMI) 34.0-34.9, adult: Secondary | ICD-10-CM | POA: Diagnosis not present

## 2022-12-02 DIAGNOSIS — K080 Exfoliation of teeth due to systemic causes: Secondary | ICD-10-CM | POA: Diagnosis not present

## 2022-12-17 DIAGNOSIS — G43109 Migraine with aura, not intractable, without status migrainosus: Secondary | ICD-10-CM | POA: Diagnosis not present

## 2022-12-24 DIAGNOSIS — K080 Exfoliation of teeth due to systemic causes: Secondary | ICD-10-CM | POA: Diagnosis not present

## 2022-12-24 HISTORY — PX: OTHER SURGICAL HISTORY: SHX169

## 2023-01-05 DIAGNOSIS — G43109 Migraine with aura, not intractable, without status migrainosus: Secondary | ICD-10-CM | POA: Diagnosis not present

## 2023-01-05 DIAGNOSIS — D518 Other vitamin B12 deficiency anemias: Secondary | ICD-10-CM | POA: Diagnosis not present

## 2023-01-05 DIAGNOSIS — E559 Vitamin D deficiency, unspecified: Secondary | ICD-10-CM | POA: Diagnosis not present

## 2023-01-05 DIAGNOSIS — G9332 Myalgic encephalomyelitis/chronic fatigue syndrome: Secondary | ICD-10-CM | POA: Diagnosis not present

## 2023-01-05 DIAGNOSIS — Z0001 Encounter for general adult medical examination with abnormal findings: Secondary | ICD-10-CM | POA: Diagnosis not present

## 2023-01-13 ENCOUNTER — Ambulatory Visit: Payer: Medicare Other | Admitting: General Surgery

## 2023-01-13 ENCOUNTER — Encounter: Payer: Self-pay | Admitting: General Surgery

## 2023-01-13 VITALS — BP 113/75 | HR 80 | Temp 98.0°F | Resp 14 | Ht 61.0 in | Wt 181.0 lb

## 2023-01-13 DIAGNOSIS — D367 Benign neoplasm of other specified sites: Secondary | ICD-10-CM | POA: Diagnosis not present
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
## 2023-01-21 DIAGNOSIS — R29898 Other symptoms and signs involving the musculoskeletal system: Secondary | ICD-10-CM | POA: Diagnosis not present

## 2023-01-21 DIAGNOSIS — Z6833 Body mass index (BMI) 33.0-33.9, adult: Secondary | ICD-10-CM | POA: Diagnosis not present

## 2023-01-21 DIAGNOSIS — R42 Dizziness and giddiness: Secondary | ICD-10-CM | POA: Diagnosis not present

## 2023-01-21 DIAGNOSIS — E6609 Other obesity due to excess calories: Secondary | ICD-10-CM | POA: Diagnosis not present

## 2023-01-21 DIAGNOSIS — J449 Chronic obstructive pulmonary disease, unspecified: Secondary | ICD-10-CM | POA: Diagnosis not present

## 2023-01-21 DIAGNOSIS — G43109 Migraine with aura, not intractable, without status migrainosus: Secondary | ICD-10-CM | POA: Diagnosis not present

## 2023-01-21 IMAGING — MG MM DIGITAL SCREENING BILAT W/ TOMO AND CAD
6 of 12 series · 6 of 36 positions shown · non-contrast
Comparison: Previous exam(s).

CLINICAL DATA: Screening.

EXAM:
DIGITAL SCREENING BILATERAL MAMMOGRAM WITH TOMOSYNTHESIS AND CAD
TECHNIQUE: Bilateral screening digital craniocaudal and mediolateral oblique
mammograms were obtained. Bilateral screening digital breast
tomosynthesis was performed. The images were evaluated with
computer-aided detection.

[L CC synth-2D (1 of 2)]
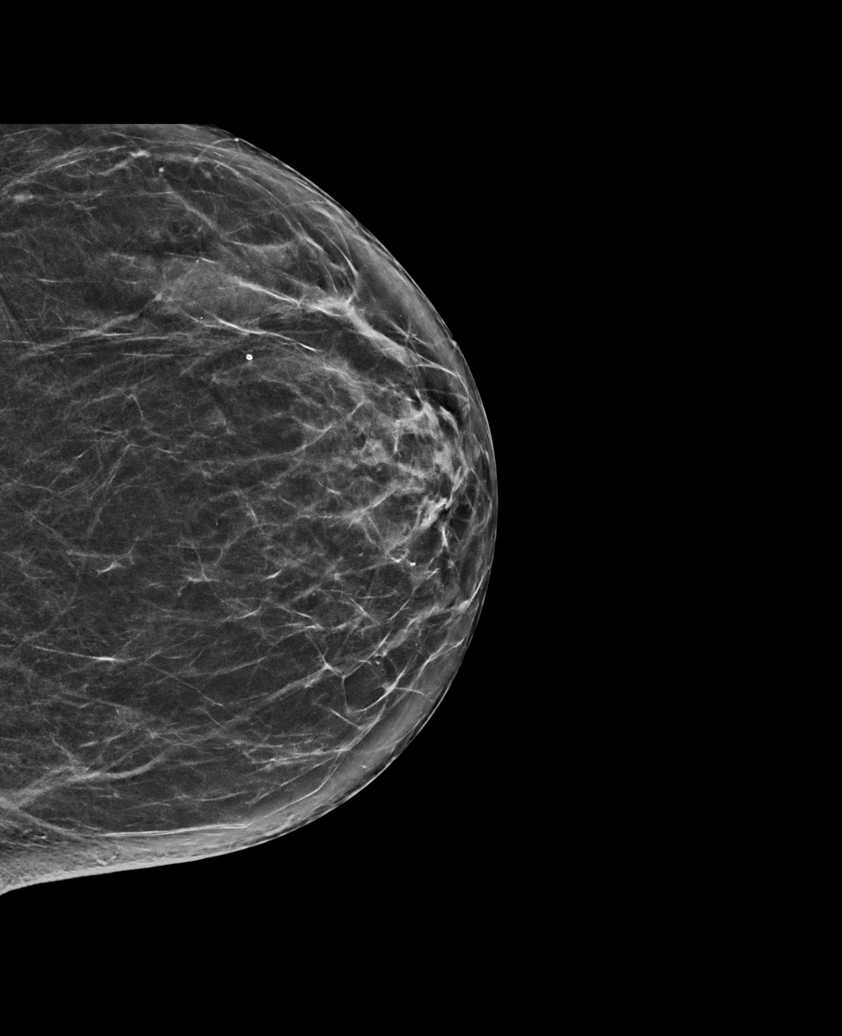

[L CC synth-2D (2 of 2)]
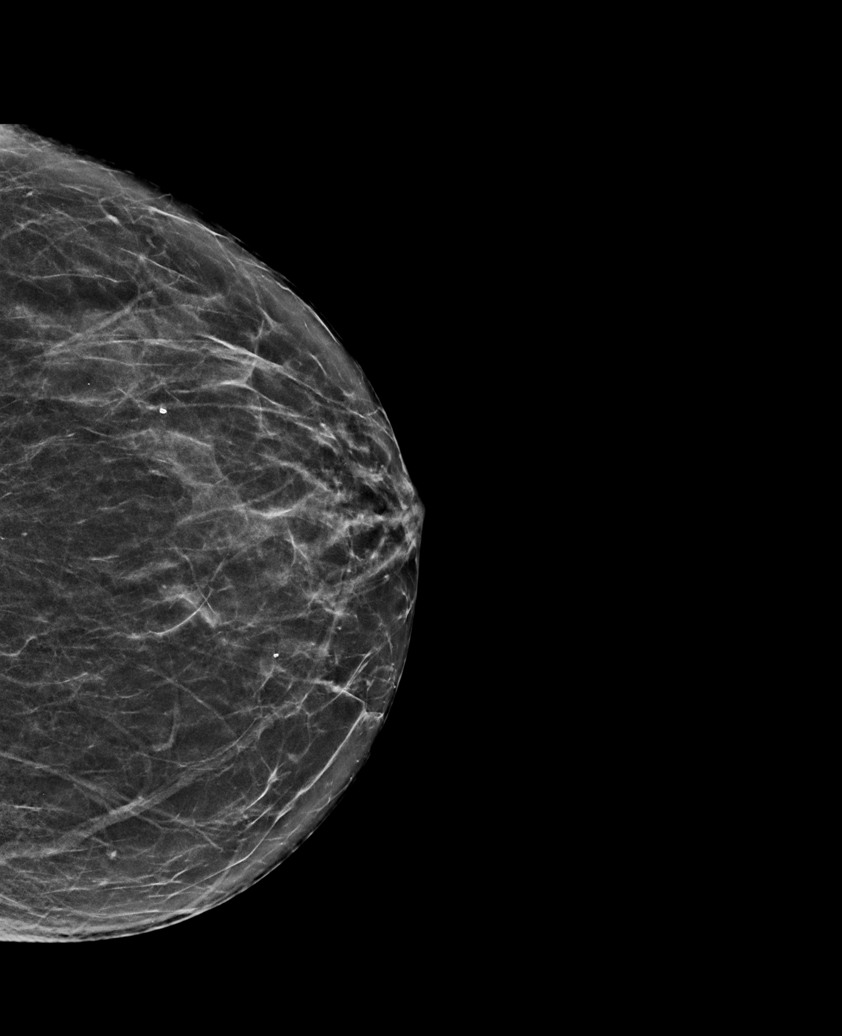

[R CC synth-2D (1 of 2)]
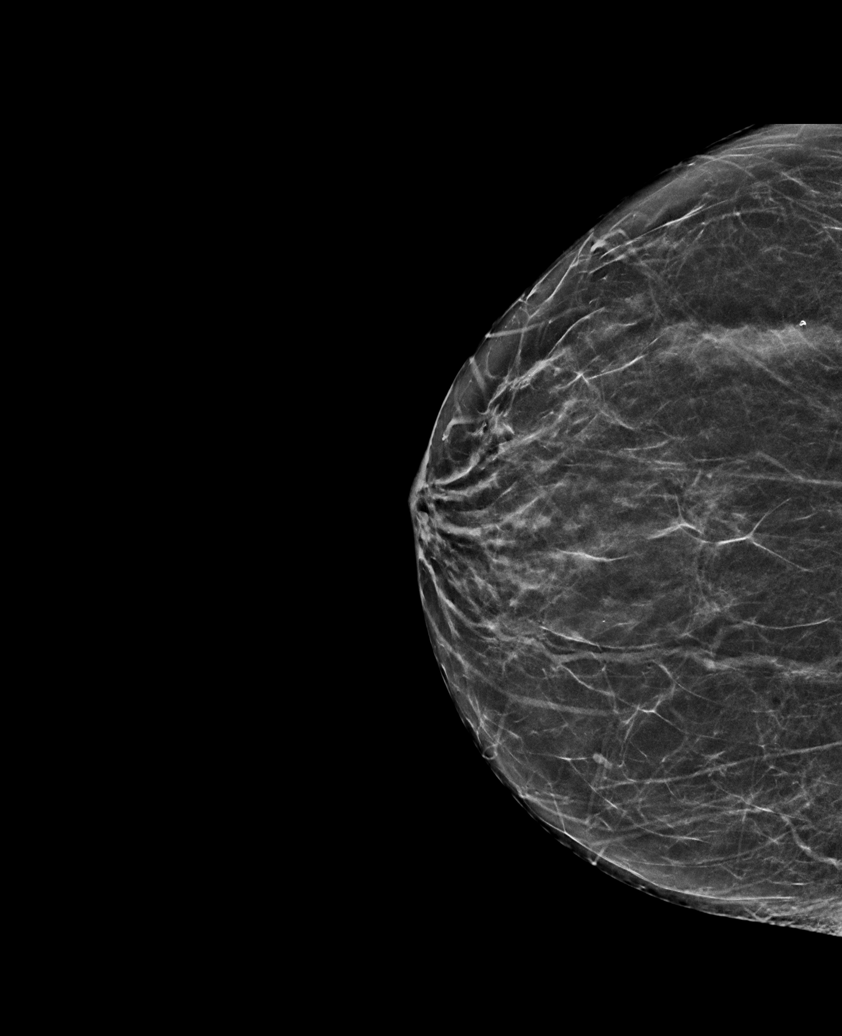

[R CC synth-2D (2 of 2)]
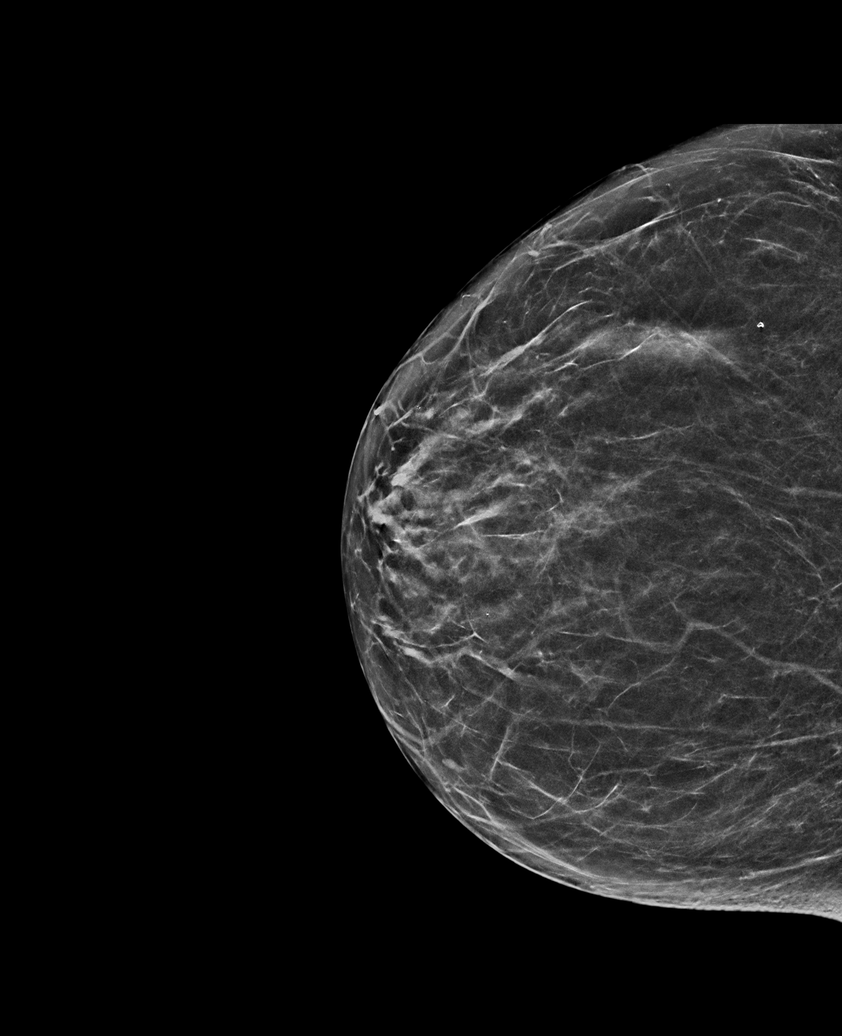

[L MLO synth-2D]
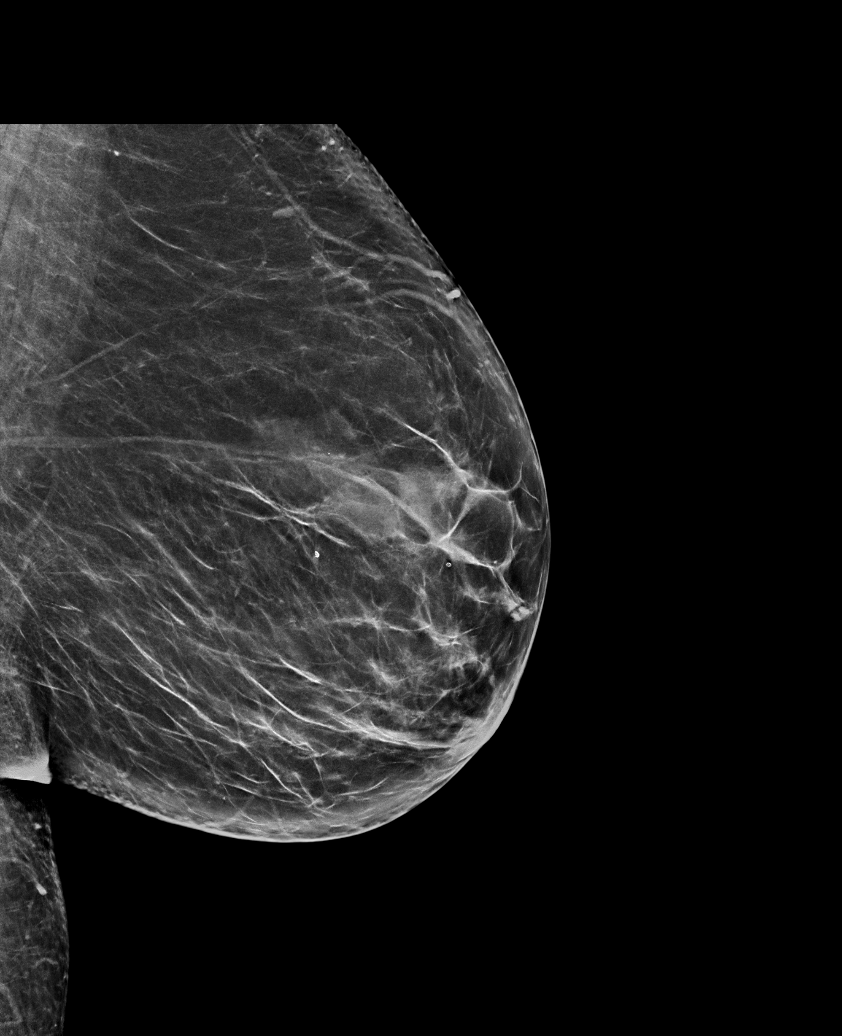

[R MLO synth-2D]
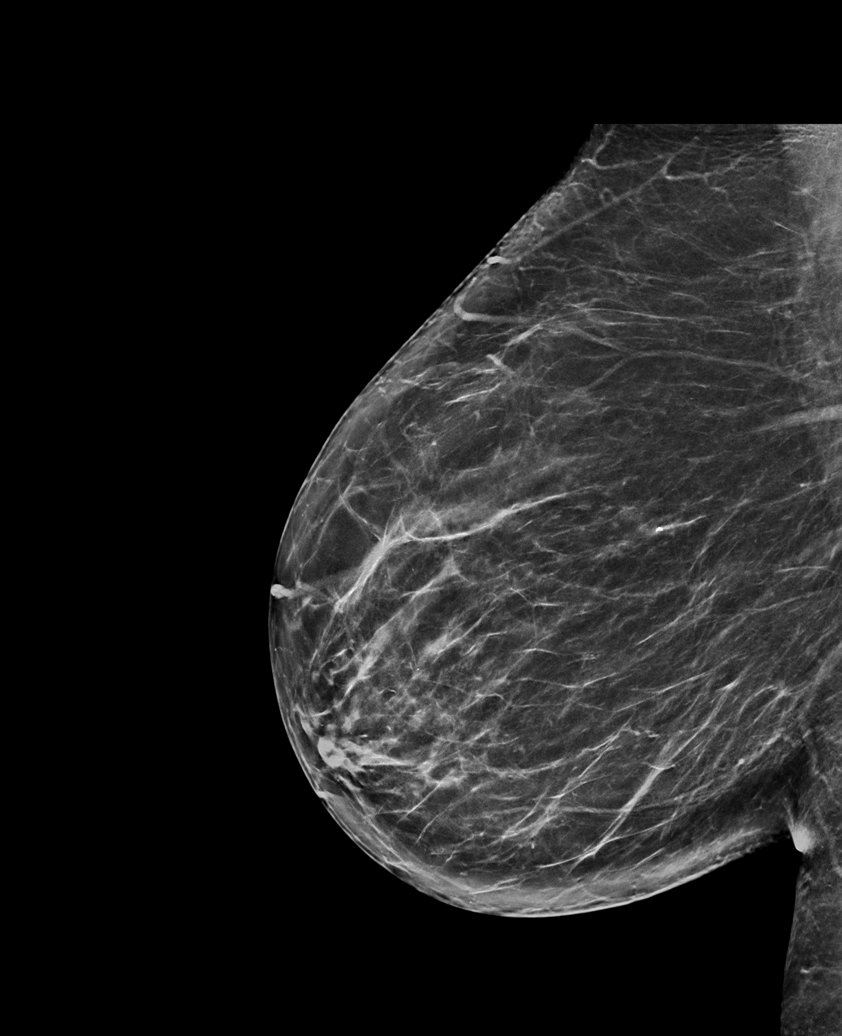

[6 of 36 positions shown; findings below may reference images not displayed]

ACR Breast Density Category b: There are scattered areas of
fibroglandular density.
FINDINGS: There are no findings suspicious for malignancy. The images were
evaluated with computer-aided detection.
IMPRESSION: No mammographic evidence of malignancy. A result letter of this
screening mammogram will be mailed directly to the patient.

RECOMMENDATION:
Screening mammogram in one year. (Code:[id number]-I-BG6)

BI-RADS CATEGORY  1: Negative.

## 2023-01-22 ENCOUNTER — Other Ambulatory Visit: Payer: Self-pay | Admitting: Internal Medicine

## 2023-01-22 DIAGNOSIS — Z1231 Encounter for screening mammogram for malignant neoplasm of breast: Secondary | ICD-10-CM

## 2023-01-23 ENCOUNTER — Ambulatory Visit
Admission: RE | Admit: 2023-01-23 | Discharge: 2023-01-23 | Disposition: A | Discharge status: Home / Self Care | Payer: Medicare Other | Source: Ambulatory Visit | Attending: Internal Medicine | Admitting: Internal Medicine

## 2023-01-23 DIAGNOSIS — Z1231 Encounter for screening mammogram for malignant neoplasm of breast: Secondary | ICD-10-CM

## 2023-01-23 DIAGNOSIS — J449 Chronic obstructive pulmonary disease, unspecified: Secondary | ICD-10-CM | POA: Diagnosis not present

## 2023-01-23 DIAGNOSIS — G43109 Migraine with aura, not intractable, without status migrainosus: Secondary | ICD-10-CM | POA: Diagnosis not present

## 2023-01-29 DIAGNOSIS — K080 Exfoliation of teeth due to systemic causes: Secondary | ICD-10-CM | POA: Diagnosis not present

## 2023-01-30 DIAGNOSIS — M25552 Pain in left hip: Secondary | ICD-10-CM | POA: Diagnosis not present

## 2023-02-11 ENCOUNTER — Ambulatory Visit (INDEPENDENT_AMBULATORY_CARE_PROVIDER_SITE_OTHER): Payer: Medicare Other | Admitting: General Surgery

## 2023-02-11 ENCOUNTER — Encounter: Payer: Self-pay | Admitting: General Surgery

## 2023-02-11 VITALS — BP 141/84 | HR 73 | Temp 98.1°F | Resp 14 | Ht 61.0 in | Wt 186.0 lb

## 2023-02-11 DIAGNOSIS — D367 Benign neoplasm of other specified sites: Secondary | ICD-10-CM | POA: Diagnosis not present

## 2023-02-11 DIAGNOSIS — L720 Epidermal cyst: Secondary | ICD-10-CM | POA: Diagnosis not present

## 2023-02-11 NOTE — Patient Instructions (Addendum)
Glue will peel up after about 5-7 days. Keep area clean after and can do neosporin to area if desired.  If shower/ wash hair do not use really hot water as this will cause the glue to peel faster.  Tylenol and ibuprofen for pain.

## 2023-02-11 NOTE — Progress Notes (Signed)
[facility] Procedure Note  02/11/23  Pre-procedure Diagnosis: Cyst on scalp at hairline    Post-procedure Diagnosis: Same   Procedure(s) Performed: Excision of cyst 1.5cm    Surgeon: Leatrice Jewels. Henreitta Leber, MD   Assistants: No qualified resident was available    Anesthesia: Lidocaine 1%    Specimens: Ruptured cyst    Estimated Blood Loss: Minimal  Wound Class: Clean contaminated    Procedure Indications: Ms. Bumgarner is a 57 yo has a cyst on the scalp she wants removed. Discussed risk of bleeding, infection, recurrence, and scarring.   Findings: Small cyst with keratin contents. Ruptured on removal.   Procedure: The patient was taken to the procedure room and placed supine. The right hairline area was prepared and draped in the usual sterile fashion. Lidocaine 1% was injected.   A small ellipse of skin was made around the central pit and carried down into the subcutaneous tissue. With scissors the cyst was freed up sharply. I then was able to express the cyst contents and wall out of the cavity. I removed any remaining cyst wall. I cauterized the cavity with silver nitrate for hemostasis as well as for de-epithelizing any remaining wall. The deep are was closed with 3-0 Vicryl and the skin was closed with a running 4-0 Subcuticular and dermabond.   Final inspection revealed acceptable hemostasis. The patient tolerated the procedure well.   Future Appointments  Date Time Provider Department Center  03/04/2023  4:10 PM Lucretia Roers, MD [facility] None   Glue will peel up after about 5-7 days. Keep area clean after and can do neosporin to area if desired.  If shower/ wash hair do not use really hot water as this will cause the glue to peel faster.  Tylenol and ibuprofen for pain.   Algis Greenhouse, MD [facility] [address] [place], [place] [address] [phone] (office)

## 2023-02-12 ENCOUNTER — Other Ambulatory Visit: Payer: Self-pay | Admitting: Family Medicine

## 2023-02-12 DIAGNOSIS — D367 Benign neoplasm of other specified sites: Secondary | ICD-10-CM

## 2023-02-16 LAB — TISSUE SPECIMEN

## 2023-02-16 LAB — PATHOLOGY REPORT

## 2023-02-17 ENCOUNTER — Other Ambulatory Visit: Payer: Self-pay

## 2023-02-17 ENCOUNTER — Ambulatory Visit (INDEPENDENT_AMBULATORY_CARE_PROVIDER_SITE_OTHER): Payer: Medicare Other | Admitting: General Surgery

## 2023-02-17 ENCOUNTER — Encounter: Payer: Self-pay | Admitting: General Surgery

## 2023-02-17 VITALS — BP 148/97 | HR 79 | Temp 98.2°F | Resp 16 | Ht 61.0 in | Wt 186.0 lb

## 2023-02-17 DIAGNOSIS — T148XXA Other injury of unspecified body region, initial encounter: Secondary | ICD-10-CM

## 2023-02-17 DIAGNOSIS — L089 Local infection of the skin and subcutaneous tissue, unspecified: Secondary | ICD-10-CM

## 2023-02-17 MED ORDER — DOXYCYCLINE HYCLATE 50 MG PO CAPS: 50.0000 mg | ORAL_CAPSULE | Freq: Two times a day (BID) | ORAL | 0 refills | Status: AC

## 2023-02-17 NOTE — Patient Instructions (Signed)
Cover area with neosporin three times daily. Can put some peroxide on area before if has been draining. Can cover with bandaid if wanted or leave open with the neosporin on the wound.   Take doxycyline for the infection.

## 2023-02-17 NOTE — Progress Notes (Unsigned)
[facility]  Doing well no issues. Some swelling in the left groin.  BP (!) 151/93   Pulse 61   Temp 98 F (36.7 C) (Oral)   Resp 14   Ht 5' 8" (1.727 m)   Wt 172 lb (78 kg)   SpO2 92%   BMI 26.15 kg/m  Port sites healing no erythema or drainage, minor swelling in the left groin  Patient s/p robotic assisted inguinal hernia repair with mesh on the left. Doing well.   Start back tennis slowly.  The swelling should improve with time.  If you want referral to an Orthopedic physician for the bicep tendon let us know.   Ichael Pullara, MD [facility] [address] [place], [place] [address] [phone] (office)  

## 2023-02-19 ENCOUNTER — Ambulatory Visit (INDEPENDENT_AMBULATORY_CARE_PROVIDER_SITE_OTHER): Payer: Medicare Other | Admitting: General Surgery

## 2023-02-19 ENCOUNTER — Encounter: Payer: Self-pay | Admitting: General Surgery

## 2023-02-19 VITALS — BP 170/86 | HR 78 | Temp 98.2°F | Resp 14 | Ht 61.0 in | Wt 183.0 lb

## 2023-02-19 DIAGNOSIS — L089 Local infection of the skin and subcutaneous tissue, unspecified: Secondary | ICD-10-CM

## 2023-02-19 DIAGNOSIS — T148XXA Other injury of unspecified body region, initial encounter: Secondary | ICD-10-CM

## 2023-02-19 MED ORDER — OXYCODONE HCL 5 MG PO TABS: 5.0000 mg | ORAL_TABLET | Freq: Three times a day (TID) | ORAL | 0 refills | Status: DC | PRN

## 2023-02-19 NOTE — Patient Instructions (Signed)
Peroxide to the wound with a q tip twice daily and neosporin into the opening twice daily. Can keep covered if desired or open if desired.  This is a pea size hole and will close up from the inside out over the next 1-2 weeks.

## 2023-02-19 NOTE — Progress Notes (Signed)
[facility]  Still with drainage. Pain. Started the antibiotic. Area is very sore.  BP (!) 170/86   Pulse 78   Temp 98.2 F (36.8 C) (Oral)   Resp 14   Ht 5\' 1"  (1.549 m)   Wt 183 lb (83 kg)   SpO2 92%   BMI 34.58 kg/m  Removed some fibrinous tissue Peroxide to area and neosporin    Patient s/p excision of cyst now with a open wound after area developed hematoma and likely infection. Antibiotics prescribed. Wound care.   Peroxide to the wound with a q tip twice daily and neosporin into the opening twice daily. Can keep covered if desired or open if desired.  This is a pea size hole and will close up from the inside out over the next 1-2 weeks.  Call if any issues and need to see someone next week.    Future Appointments  Date Time Provider Department Center  03/04/2023  4:10 PM Lucretia Roers, MD [facility] None   Algis Greenhouse, MD [facility] [address] [place], [place] [address] [phone] (office)

## 2023-02-23 DIAGNOSIS — G43109 Migraine with aura, not intractable, without status migrainosus: Secondary | ICD-10-CM | POA: Diagnosis not present

## 2023-03-02 DIAGNOSIS — K080 Exfoliation of teeth due to systemic causes: Secondary | ICD-10-CM | POA: Diagnosis not present

## 2023-03-04 ENCOUNTER — Ambulatory Visit (INDEPENDENT_AMBULATORY_CARE_PROVIDER_SITE_OTHER): Payer: Medicare Other | Admitting: General Surgery

## 2023-03-04 ENCOUNTER — Encounter: Payer: Self-pay | Admitting: General Surgery

## 2023-03-04 VITALS — BP 143/89 | HR 88 | Temp 98.1°F | Resp 14 | Ht 61.0 in | Wt 178.0 lb

## 2023-03-04 DIAGNOSIS — D367 Benign neoplasm of other specified sites: Secondary | ICD-10-CM

## 2023-03-04 DIAGNOSIS — K080 Exfoliation of teeth due to systemic causes: Secondary | ICD-10-CM | POA: Diagnosis not present

## 2023-03-04 DIAGNOSIS — L089 Local infection of the skin and subcutaneous tissue, unspecified: Secondary | ICD-10-CM

## 2023-03-04 DIAGNOSIS — T148XXA Other injury of unspecified body region, initial encounter: Secondary | ICD-10-CM

## 2023-03-04 NOTE — Patient Instructions (Signed)
Keep area clean and dry. Can use neosporin if needed.

## 2023-03-04 NOTE — Progress Notes (Signed)
[facility]  Head wound healing. No drainage at this time. Has drained some blood but minimal. Scabbed over.  BP (!) 143/89   Pulse 88   Temp 98.1 F (36.7 C) (Oral)   Resp 14   Ht 5\' 1"  (1.549 m)   Wt 178 lb (80.7 kg)   SpO2 91%   BMI 33.63 kg/m  Scabbed over No redness  Patient s/p cyst excision complicated by hematoma with infection. Healed by secondary intention.  Doing well.  Keep area clean and dry. Can use neosporin if needed.    PRN Follow up  Algis Greenhouse, MD [facility] [address] [place], [place] [address] [phone] (office)

## 2023-03-18 DIAGNOSIS — K080 Exfoliation of teeth due to systemic causes: Secondary | ICD-10-CM | POA: Diagnosis not present

## 2023-03-19 DIAGNOSIS — G894 Chronic pain syndrome: Secondary | ICD-10-CM | POA: Diagnosis not present

## 2023-03-19 DIAGNOSIS — M65311 Trigger thumb, right thumb: Secondary | ICD-10-CM | POA: Diagnosis not present

## 2023-03-19 DIAGNOSIS — G43011 Migraine without aura, intractable, with status migrainosus: Secondary | ICD-10-CM | POA: Diagnosis not present

## 2023-03-19 DIAGNOSIS — Z6833 Body mass index (BMI) 33.0-33.9, adult: Secondary | ICD-10-CM | POA: Diagnosis not present

## 2023-03-19 DIAGNOSIS — F419 Anxiety disorder, unspecified: Secondary | ICD-10-CM | POA: Diagnosis not present

## 2023-03-19 DIAGNOSIS — J449 Chronic obstructive pulmonary disease, unspecified: Secondary | ICD-10-CM | POA: Diagnosis not present

## 2023-03-19 DIAGNOSIS — E6609 Other obesity due to excess calories: Secondary | ICD-10-CM | POA: Diagnosis not present

## 2023-04-06 DIAGNOSIS — K080 Exfoliation of teeth due to systemic causes: Secondary | ICD-10-CM | POA: Diagnosis not present

## 2023-04-09 ENCOUNTER — Ambulatory Visit (INDEPENDENT_AMBULATORY_CARE_PROVIDER_SITE_OTHER): Payer: Medicare Other | Admitting: Orthopedic Surgery

## 2023-04-09 ENCOUNTER — Encounter: Payer: Self-pay | Admitting: Orthopedic Surgery

## 2023-04-09 VITALS — BP 128/80 | HR 90 | Ht 60.0 in | Wt 180.0 lb

## 2023-04-09 DIAGNOSIS — M65341 Trigger finger, right ring finger: Secondary | ICD-10-CM

## 2023-04-09 MED ORDER — METHYLPREDNISOLONE ACETATE 40 MG/ML IJ SUSP
40.0000 mg | Freq: Once | INTRAMUSCULAR | Status: AC
  Administered 2023-04-09: 40 mg via INTRA_ARTICULAR

## 2023-04-09 NOTE — Progress Notes (Signed)
Patient ID: Danielle Montoya, female   DOB: [birth year], 57 y.o.   MRN: [id number]  ASSESSMENT AND PLAN  Right ring finger trigger phenomenon  Injection    Chief Complaint  Patient presents with   Hand Problem    Right ring finger triggering painful     HPI Danielle Montoya is a 57 y.o. female.  Presents with several week history of pain over the A1 pulley with triggering and locking of the right ring finger  Review of Systems Review of Systems  Past Medical History:  Diagnosis Date   Anxiety    Arthritis    right knee and left arm   Depression    GERD (gastroesophageal reflux disease)    Headache(784.0)    Migraines   Seasonal allergies    Torn muscle    Left arm    Past Surgical History:  Procedure Laterality Date   ABDOMINAL HYSTERECTOMY     arm surgery     BREAST CYST EXCISION Right 2023   CHOLECYSTECTOMY     dysfunctional   COLONOSCOPY  06/10/2012   Procedure: COLONOSCOPY;  Surgeon: Malissa Hippo, MD;  [facility];  Service: Endoscopy;  Laterality: N/A;  1200   MICROLARYNGOSCOPY Right 09/15/2016   Procedure: RIGHT VOCAL CORD MICRO DIRECT LARYNGOSCOPY WITH BIOPSY;  Surgeon: Newman Pies, MD;  Location: [facility];  Service: ENT;  Laterality: Right;   TONSILLECTOMY      Family History  Problem Relation Age of Onset   Colon cancer Mother    Breast cancer Maternal Aunt     Social History Social History   Tobacco Use   Smoking status: Every Day    Current packs/day: 1.00    Average packs/day: 1 pack/day for 32.0 years (32.0 ttl pk-yrs)    Types: Cigarettes   Smokeless tobacco: Never   Tobacco comments:    1/2 pack a day. Smoking since age 50.  Substance Use Topics   Alcohol use: No   Drug use: No    Allergies  Allergen Reactions   Penicillins Anaphylaxis, Nausea And Vomiting and Rash   Prednisone Itching   Shellfish Allergy Nausea And Vomiting    All seafood causes patient to be extremely sick.   Aspirin Nausea And Vomiting  and Rash   Erythromycin Nausea And Vomiting and Rash    Current Outpatient Medications  Medication Sig Dispense Refill   albuterol (ACCUNEB) 0.63 MG/3ML nebulizer solution Take 1 ampule by nebulization every 6 (six) hours as needed for wheezing.     albuterol (PROVENTIL) 4 MG tablet Take 4 mg by mouth 4 (four) times daily as needed for wheezing.     butalbital-acetaminophen-caffeine (FIORICET, ESGIC) 50-325-40 MG per tablet Take 1 tablet by mouth every 6 (six) hours as needed for headache or migraine.     furosemide (LASIX) 20 MG tablet Take 20 mg by mouth 2 (two) times daily as needed.     ketorolac (TORADOL) 10 MG tablet Take by mouth.     LORazepam (ATIVAN) 1 MG tablet Take 1 mg by mouth every 8 (eight) hours.     promethazine (PHENERGAN) 25 MG tablet Take 25 mg by mouth 4 (four) times daily as needed.     propranolol (INDERAL) 20 MG tablet Take 20 mg by mouth 2 (two) times daily.     rOPINIRole (REQUIP) 1 MG tablet Take 1 mg by mouth at bedtime.     rosuvastatin (CRESTOR) 10 MG tablet Take 10 mg by mouth daily.  traZODone (DESYREL) 100 MG tablet Take 100 mg by mouth at bedtime.     No current facility-administered medications for this visit.     Physical Exam BP 128/80   Pulse 90   Ht 5' (1.524 m)   Wt 180 lb (81.6 kg)   BMI 35.15 kg/m  Body mass index is 35.15 kg/m.  Appearance, there are no abnormalities in terms of appearance the patient was well-developed and well-nourished. The grooming and hygiene were normal.  Mental status orientation, there was normal alertness and orientation Mood pleasant Ambulatory status normal with no assistive devices  Examination of the right hand reveals tenderness over the A1 pulley of the right ring finger Range of motion remains normal with clicking on flexion extension.  Stability tests show no abnormality of the IP joints are MP joint. The FDP and FDS strength is normal Skin warm dry and intact without laceration or ulceration or  erythema Neurologic examination normal sensation Vascular examination normal pulses with warm extremity and normal capillary refill     MEDICAL DECISION MAKING  A.  Encounter Diagnosis  Name Primary?   Trigger finger, right ring finger Yes    B. DATA ANALYSED:  Notes were sent over but they were not related to this problem  IMAGING: Independent interpretation of images: No images  Orders: No orders  Outside records reviewed: Records reviewed but not pertinent  C. MANAGEMENT injection  Trigger finger injection  Diagnosis tenosynovitis right ring finger Procedure injection A1 pulley Medications lidocaine 1% 1 mL and Depo-Medrol 40 mg 1 mL Skin prep alcohol and ethyl chloride Verbal consent was obtained Timeout confirmed the injection site  After cleaning the skin with alcohol and anesthetizing the skin with ethyl chloride the A1 pulley was palpated and the injection was performed without complication   Meds ordered this encounter  Medications   methylPREDNISolone acetate (DEPO-MEDROL) injection 40 mg

## 2023-04-12 DIAGNOSIS — L299 Pruritus, unspecified: Secondary | ICD-10-CM | POA: Diagnosis not present

## 2023-04-12 DIAGNOSIS — Z6833 Body mass index (BMI) 33.0-33.9, adult: Secondary | ICD-10-CM | POA: Diagnosis not present

## 2023-04-12 DIAGNOSIS — L03011 Cellulitis of right finger: Secondary | ICD-10-CM | POA: Diagnosis not present

## 2023-04-12 DIAGNOSIS — E669 Obesity, unspecified: Secondary | ICD-10-CM | POA: Diagnosis not present

## 2023-04-13 ENCOUNTER — Ambulatory Visit (INDEPENDENT_AMBULATORY_CARE_PROVIDER_SITE_OTHER): Payer: Medicare Other | Admitting: Orthopedic Surgery

## 2023-04-13 DIAGNOSIS — T7840XA Allergy, unspecified, initial encounter: Secondary | ICD-10-CM | POA: Diagnosis not present

## 2023-04-13 DIAGNOSIS — M65341 Trigger finger, right ring finger: Secondary | ICD-10-CM | POA: Diagnosis not present

## 2023-04-13 DIAGNOSIS — L0390 Cellulitis, unspecified: Secondary | ICD-10-CM

## 2023-04-13 NOTE — Progress Notes (Signed)
Chief Complaint  Patient presents with   Follow-up    Recheck on right hand and arm itching and swelling post trigger finger injection.   The patient was seen August 15 for standard trigger finger injection of the right ring finger presented to the urgent care quick care on Sunday after noticing pain and swelling in her arm on Saturday  She was put on doxycycline and also given a cream for her hand comes in with swelling long and ring finger redness at the injection site and just distal to it and then some redness over the medial side of the elbow but no connecting erythema between the 2 areas  There is no tenderness in the forearm but there is tenderness and dorsal swelling on the hand  Very unusual  Questionable diagnosis  Is this reaction or is this infection  Recommend 48 hours of the antibiotic and then follow-up to see Dr. Salena Saner please have him review the pictures to see if it is any worse and then she can see me on Friday as an add-on   Encounter Diagnoses  Name Primary?   Trigger finger, right ring finger Yes   Cellulitis, unspecified cellulitis site    Allergic reaction, initial encounter     I told her she may need IV antibiotics

## 2023-04-15 ENCOUNTER — Encounter: Payer: Self-pay | Admitting: Orthopedic Surgery

## 2023-04-15 ENCOUNTER — Ambulatory Visit: Payer: Medicare Other | Admitting: Orthopedic Surgery

## 2023-04-15 VITALS — BP 154/87 | HR 81

## 2023-04-15 DIAGNOSIS — G43011 Migraine without aura, intractable, with status migrainosus: Secondary | ICD-10-CM | POA: Diagnosis not present

## 2023-04-15 DIAGNOSIS — T7840XD Allergy, unspecified, subsequent encounter: Secondary | ICD-10-CM

## 2023-04-15 MED ORDER — HYDROXYZINE HCL 10 MG PO TABS: 10.0000 mg | ORAL_TABLET | Freq: Three times a day (TID) | ORAL | 0 refills | Status: DC | PRN

## 2023-04-15 NOTE — Patient Instructions (Signed)
Follow-up with Dr. Romeo Apple on Friday, August 23.  Okay to overbook.

## 2023-04-15 NOTE — Progress Notes (Signed)
[facility] Return  Assessment: Danielle Montoya is a 57 y.o. female with the following: Allergic reaction following trigger finger injection  Plan: Danielle Montoya feels better.  Although, she continues to have a lot of itching.  I provided her with a prescription for hydroxyzine, in hopes of improving her side effects.  Continue medications otherwise.  Dr. Romeo Apple would like to see her again on Friday.  Meds ordered this encounter  Medications   hydrOXYzine (ATARAX) 10 MG tablet    Sig: Take 1 tablet (10 mg total) by mouth 3 (three) times daily as needed.    Dispense:  30 tablet    Refill:  0     Follow-up: Return in about 2 days (around 04/17/2023).   Subjective:  Chief Complaint  Patient presents with   Hand Pain    F/u swelling after injection on 04/09/23    History of Present Illness: Danielle Montoya is a 57 y.o. female who returns to clinic for repeat evaluation of right ring finger.  She had a trigger injection last week.  She had some associated bruising, swelling and concern for cellulitis.  She has been taking antibiotics.  Her swelling is improving.  She continues to have a lot of itching.  Review of Systems: Itching  No fevers or chills  Objective: BP (!) 154/87   Pulse 81   Physical Exam:         IMAGING: I personally ordered and reviewed the following images:  No new imaging obtained today.  Oliver Barre, MD 04/15/2023 9:44 AM

## 2023-04-17 ENCOUNTER — Encounter: Payer: Self-pay | Admitting: Orthopedic Surgery

## 2023-04-17 ENCOUNTER — Ambulatory Visit: Payer: Medicare Other | Admitting: Orthopedic Surgery

## 2023-04-17 DIAGNOSIS — L0390 Cellulitis, unspecified: Secondary | ICD-10-CM

## 2023-04-17 DIAGNOSIS — T7840XD Allergy, unspecified, subsequent encounter: Secondary | ICD-10-CM | POA: Diagnosis not present

## 2023-04-17 DIAGNOSIS — M65341 Trigger finger, right ring finger: Secondary | ICD-10-CM

## 2023-04-17 MED ORDER — DOXYCYCLINE HYCLATE 100 MG PO CAPS: 100.0000 mg | ORAL_CAPSULE | Freq: Two times a day (BID) | ORAL | 0 refills | Status: DC

## 2023-04-17 NOTE — Progress Notes (Signed)
Chief Complaint  Patient presents with   Hand Problem    Right hand improving    Encounter Diagnoses  Name Primary?   Trigger finger, right ring finger    Cellulitis, unspecified cellulitis site    Allergic reaction, subsequent encounter Yes    The patient had an injection on 04/09/2019 for right ring finger she developed some type of reaction we could not tell if it was infection or allergic reaction.  She was put on Vibramycin Dr. Clydie Braun saw her in my absence and put her on Atarax for swelling she continue the Vibramycin she has improved see the pictures in the chart  She wants to have surgery to stop the finger from triggering.  She will finish her antibiotics after the refill I gave her today I will see her in 2 weeks to schedule for a right ring finger trigger finger release  Meds ordered this encounter  Medications   doxycycline (VIBRAMYCIN) 100 MG capsule    Sig: Take 1 capsule (100 mg total) by mouth 2 (two) times daily.    Dispense:  28 capsule    Refill:  0

## 2023-05-01 ENCOUNTER — Ambulatory Visit: Payer: Medicare Other | Admitting: Orthopedic Surgery

## 2023-05-01 ENCOUNTER — Encounter: Payer: Self-pay | Admitting: Orthopedic Surgery

## 2023-05-01 VITALS — BP 150/95 | HR 90 | Ht 60.0 in | Wt 180.0 lb

## 2023-05-01 DIAGNOSIS — T7840XD Allergy, unspecified, subsequent encounter: Secondary | ICD-10-CM

## 2023-05-01 DIAGNOSIS — L0390 Cellulitis, unspecified: Secondary | ICD-10-CM

## 2023-05-01 DIAGNOSIS — M65341 Trigger finger, right ring finger: Secondary | ICD-10-CM | POA: Diagnosis not present

## 2023-05-01 MED ORDER — DOXYCYCLINE HYCLATE 100 MG PO CAPS: 100.0000 mg | ORAL_CAPSULE | Freq: Two times a day (BID) | ORAL | 0 refills | Status: DC

## 2023-05-01 MED ORDER — HYDROXYZINE HCL 10 MG PO TABS: 10.0000 mg | ORAL_TABLET | Freq: Three times a day (TID) | ORAL | 0 refills | Status: DC | PRN

## 2023-05-01 NOTE — Progress Notes (Signed)
Chief Complaint  Patient presents with   Hand Problem    Injection reaction right hand still itching     Encounter Diagnoses  Name Primary?   Trigger finger, right ring finger    Cellulitis, unspecified cellulitis site Yes   Allergic reaction, subsequent encounter     This is a follow-up visit  The patient had a trigger finger injection on 15 August and developed some type of reaction.  She is allergic to prednisone.  We use Depo-Medrol.  She had an allergic reaction with itching swelling possible cellulitis this was treated with doxycycline Atarax and steroid cream  She is improving the swelling is going down she still has the itching over the finger.  There is a chance this also may have been a superficial burn from the ethyl chloride  Nonetheless she is not a candidate for repeat injections  She would like the right ring finger as well as the long and index finger to be released at the time of surgery which is planned up roughly 4 weeks from now  I will see her in 2 weeks she will continue the medications that she is taking  Meds ordered this encounter  Medications   doxycycline (VIBRAMYCIN) 100 MG capsule    Sig: Take 1 capsule (100 mg total) by mouth 2 (two) times daily.    Dispense:  28 capsule    Refill:  0   hydrOXYzine (ATARAX) 10 MG tablet    Sig: Take 1 tablet (10 mg total) by mouth 3 (three) times daily as needed.    Dispense:  30 tablet    Refill:  0

## 2023-05-11 DIAGNOSIS — G43011 Migraine without aura, intractable, with status migrainosus: Secondary | ICD-10-CM | POA: Diagnosis not present

## 2023-05-15 ENCOUNTER — Ambulatory Visit: Payer: Medicare Other | Admitting: Orthopedic Surgery

## 2023-05-15 VITALS — BP 129/79 | HR 69 | Ht 60.0 in | Wt 180.0 lb

## 2023-05-15 DIAGNOSIS — M65331 Trigger finger, right middle finger: Secondary | ICD-10-CM

## 2023-05-15 DIAGNOSIS — M65341 Trigger finger, right ring finger: Secondary | ICD-10-CM | POA: Diagnosis not present

## 2023-05-15 DIAGNOSIS — Z01818 Encounter for other preprocedural examination: Secondary | ICD-10-CM

## 2023-05-15 NOTE — Progress Notes (Signed)
Follow-up preop  Chief Complaint  Patient presents with   Hand Problem    Trigger finger right ring, ready to schedule surgery / last antibiotic dose is today    57 year old female with longstanding trigger fingers in the right hand.  She developed some type of allergic reaction to a trigger finger injection on the right hand and had to be treated with oral antibiotics and prednisone  She would like to have the right ring and right long finger released due to chronic pain catching and locking  Review of systems no chest pain shortness of breath or fever did not no blurred vision or headaches GI symptoms negative no GU symptoms skin is intact no numbness or tingling nervousness bleeding excessive thirst or food reactions  Allergies  Allergen Reactions   Penicillins Anaphylaxis, Nausea And Vomiting and Rash   Prednisone Itching   Shellfish Allergy Nausea And Vomiting    All seafood causes patient to be extremely sick.   Aspirin Nausea And Vomiting and Rash   Erythromycin Nausea And Vomiting and Rash   BP 129/79   Pulse 69   Ht 5' (1.524 m)   Wt 180 lb (81.6 kg)   BMI 35.15 kg/m   General Appearance normal  She is oriented x 3  Mood and affect normal  Gait and Station no abnormalities  Pulse and perfusion are normal temperature normal no edema swelling or varicose veins in the right hand  Lymph nodes elbow normal  Sensation in the hand normal  Reflexes normal no pathologic reflexes  Coordination balance normal  Right hand tenderness in the A1 pulley of the right ring and right long finger with full range of motion some catching and some locking no instability and tendon function normal extension and flexion  Encounter Diagnoses  Name Primary?   Pre-op exam Yes   Trigger finger, right ring finger    Trigger finger, right middle finger     Plan trigger finger release right ring finger right long finger

## 2023-05-15 NOTE — Addendum Note (Signed)
Addended byCaffie Damme on: 05/15/2023 10:27 AM   Modules accepted: Orders

## 2023-05-25 DIAGNOSIS — I872 Venous insufficiency (chronic) (peripheral): Secondary | ICD-10-CM | POA: Diagnosis not present

## 2023-05-25 DIAGNOSIS — E6609 Other obesity due to excess calories: Secondary | ICD-10-CM | POA: Diagnosis not present

## 2023-05-25 DIAGNOSIS — G43011 Migraine without aura, intractable, with status migrainosus: Secondary | ICD-10-CM | POA: Diagnosis not present

## 2023-05-25 DIAGNOSIS — Z6833 Body mass index (BMI) 33.0-33.9, adult: Secondary | ICD-10-CM | POA: Diagnosis not present

## 2023-05-25 DIAGNOSIS — L03119 Cellulitis of unspecified part of limb: Secondary | ICD-10-CM | POA: Diagnosis not present

## 2023-05-29 ENCOUNTER — Ambulatory Visit (HOSPITAL_COMMUNITY)
Admission: RE | Admit: 2023-05-29 | Discharge: 2023-05-29 | Disposition: A | Discharge status: Home / Self Care | Payer: Medicare Other | Source: Ambulatory Visit | Attending: Internal Medicine | Admitting: Internal Medicine

## 2023-05-29 ENCOUNTER — Other Ambulatory Visit (HOSPITAL_COMMUNITY): Payer: Self-pay | Admitting: Internal Medicine

## 2023-05-29 DIAGNOSIS — G43011 Migraine without aura, intractable, with status migrainosus: Secondary | ICD-10-CM | POA: Diagnosis not present

## 2023-05-29 DIAGNOSIS — M79672 Pain in left foot: Secondary | ICD-10-CM

## 2023-05-29 DIAGNOSIS — M25572 Pain in left ankle and joints of left foot: Secondary | ICD-10-CM | POA: Diagnosis not present

## 2023-05-29 DIAGNOSIS — E6609 Other obesity due to excess calories: Secondary | ICD-10-CM | POA: Diagnosis not present

## 2023-05-29 DIAGNOSIS — G894 Chronic pain syndrome: Secondary | ICD-10-CM | POA: Diagnosis not present

## 2023-05-29 DIAGNOSIS — Z6833 Body mass index (BMI) 33.0-33.9, adult: Secondary | ICD-10-CM | POA: Diagnosis not present

## 2023-05-29 DIAGNOSIS — Q666 Other congenital valgus deformities of feet: Secondary | ICD-10-CM | POA: Diagnosis not present

## 2023-06-05 NOTE — Patient Instructions (Signed)
Danielle Montoya  06/05/2023     @PREFPERIOPPHARMACY @   Your procedure is scheduled on  06/10/2023.   Report to [facility] at  0600  A.M.   Call this number if you have problems the morning of surgery:  [phone]  If you experience any cold or flu symptoms such as cough, fever, chills, shortness of breath, etc. between now and your scheduled surgery, please notify us at the above number.   Remember:     Use your nebulizer before you come and bring your rescue inhaler with you.    Do not eat after midnight.    You may drink clear liquids until 0330 am on 06/10/2023.    Clear liquids allowed are:                    Water, Black Coffee Only (No creamer, milk or cream, including half & half and powdered creamer), and Clear Sports drink (No red color; diabetics please choose diet or no sugar options)     Take these medicines the morning of surgery with A SIP OF WATER        proventil, fioricet or toradol(if needed), hydroxyzine.    Do not wear jewelry, make-up or nail polish, including gel polish,  artificial nails, or any other type of covering on natural nails (fingers and  toes).  Do not wear lotions, powders, or perfumes, or deodorant.  Do not shave 48 hours prior to surgery.  Men may shave face and neck.  Do not bring valuables to the hospital.  [facility] is not responsible for any belongings or valuables.  Contacts, dentures or bridgework may not be worn into surgery.  Leave your suitcase in the car.  After surgery it may be brought to your room.  For patients admitted to the hospital, discharge time will be determined by your treatment team.  Patients discharged the day of surgery will not be allowed to drive home and must have someone with them for 24 hours.    Special instructions:   DO NOT smoke tobacco or vape for 24 hours before your procedure.  Please read over the following fact sheets that you were given. Coughing and Deep Breathing, Surgical  Site Infection Prevention, Anesthesia Post-op Instructions, and Care and Recovery After Surgery        Trigger Finger Release, Care After After trigger finger release, it is common to have: Stiffness. Soreness. Swelling. Follow these instructions at home: Incision care  Keep the compression bandage on for 48 hours or as told by your health care provider. After removing it, follow instructions about how to take care of your incision. Make sure you: Wash your hands with soap and water for at least 20 seconds before and after you change your bandage (dressing). If soap and water are not available, use hand sanitizer. Change your dressing as told by your provider. Leave stitches (sutures), skin glue, or tape strips in place. These skin closures may need to stay in place for 2 weeks or longer. If tape strip edges start to loosen and curl up, you may trim the loose edges. Do not remove tape strips completely unless your provider tells you to do that. Keep your hand and dressing clean and dry. Check your incision area every day for signs of infection. Check for: More redness, swelling, or pain. Fluid or blood. Warmth. Pus or a bad smell. Bathing Do not take baths, swim, or use a hot  tub until your provider approves. Keep your dressing dry until your provider says it can be removed. Cover it with a watertight covering when you take a bath or a shower. Managing pain, stiffness, and swelling  If told, put ice on your palm. Put ice in a plastic bag. Place a towel between your skin and the bag. Leave the ice on for 20 minutes, 2-3 times a day. If your skin turns bright red, remove the ice right away to prevent skin damage. The risk of damage is higher if you cannot feel pain, heat, or cold. Move your fingers often to reduce stiffness and swelling. Raise (elevate) your hand above the level of your heart while you are sitting or lying down. Activity If you were given a sedative during the  procedure, it can affect you for several hours. Do not drive or operate machinery until your provider says that it is safe. You may have to avoid lifting. Ask your provider how much you can safely lift. Avoid any activity that causes pain. It may take 4-6 months for stiffness to go away. Return to your normal activities as told by your provider. Ask your provider what activities are safe for you. If hand therapy was prescribed, do exercises as told. This will help you regain movement. General instructions Take over-the-counter and prescription medicines only as told by your provider. Do not use any products that contain nicotine or tobacco. These products include cigarettes, chewing tobacco, and vaping devices, such as e-cigarettes. If you need help quitting, ask your provider. Keep all follow-up visits. If you have sutures, these will be removed in about 10-14 days. Your health care provider may give you more instructions. Make sure you know what you can and cannot do. Contact a health care provider if: You have chills or fever. You have any signs of infection. You are unable to move your finger because of pain or stiffness. You have any tingling or numbness in your hand or fingers. This information is not intended to replace advice given to you by your health care provider. Make sure you discuss any questions you have with your health care provider. Document Revised: 03/25/2022 Document Reviewed: 03/25/2022 Elsevier Patient Education  2024 [employer] General Anesthesia, Adult, Care After The following information offers guidance on how to care for yourself after your procedure. Your health care provider may also give you more specific instructions. If you have problems or questions, contact your health care provider. What can I expect after the procedure? After the procedure, it is common for people to: Have pain or discomfort at the IV site. Have nausea or vomiting. Have a sore throat  or hoarseness. Have trouble concentrating. Feel cold or chills. Feel weak, sleepy, or tired (fatigue). Have soreness and body aches. These can affect parts of the body that were not involved in surgery. Follow these instructions at home: For the time period you were told by your health care provider:  Rest. Do not participate in activities where you could fall or become injured. Do not drive or use machinery. Do not drink alcohol. Do not take sleeping pills or medicines that cause drowsiness. Do not make important decisions or sign legal documents. Do not take care of children on your own. General instructions Drink enough fluid to keep your urine pale yellow. If you have sleep apnea, surgery and certain medicines can increase your risk for breathing problems. Follow instructions from your health care provider about wearing your sleep device: Anytime you are  sleeping, including during daytime naps. While taking prescription pain medicines, sleeping medicines, or medicines that make you drowsy. Return to your normal activities as told by your health care provider. Ask your health care provider what activities are safe for you. Take over-the-counter and prescription medicines only as told by your health care provider. Do not use any products that contain nicotine or tobacco. These products include cigarettes, chewing tobacco, and vaping devices, such as e-cigarettes. These can delay incision healing after surgery. If you need help quitting, ask your health care provider. Contact a health care provider if: You have nausea or vomiting that does not get better with medicine. You vomit every time you eat or drink. You have pain that does not get better with medicine. You cannot urinate or have bloody urine. You develop a skin rash. You have a fever. Get help right away if: You have trouble breathing. You have chest pain. You vomit blood. These symptoms may be an emergency. Get help right  away. Call 911. Do not wait to see if the symptoms will go away. Do not drive yourself to the hospital. Summary After the procedure, it is common to have a sore throat, hoarseness, nausea, vomiting, or to feel weak, sleepy, or fatigue. For the time period you were told by your health care provider, do not drive or use machinery. Get help right away if you have difficulty breathing, have chest pain, or vomit blood. These symptoms may be an emergency. This information is not intended to replace advice given to you by your health care provider. Make sure you discuss any questions you have with your health care provider. Document Revised: 11/08/2021 Document Reviewed: 11/08/2021 Elsevier Patient Education  2024 Else[employer] Inc. How to Use Chlorhexidine Before Surgery Chlorhexidine gluconate (CHG) is a germ-killing (antiseptic) solution that is used to clean the skin. It can get rid of the bacteria that normally live on the skin and can keep them away for about 24 hours. To clean your skin with CHG, you may be given: A CHG solution to use in the shower or as part of a sponge bath. A prepackaged cloth that contains CHG. Cleaning your skin with CHG may help lower the risk for infection: While you are staying in the intensive care unit of the hospital. If you have a vascular access, such as a central line, to provide short-term or long-term access to your veins. If you have a catheter to drain urine from your bladder. If you are on a ventilator. A ventilator is a machine that helps you breathe by moving air in and out of your lungs. After surgery. What are the risks? Risks of using CHG include: A skin reaction. Hearing loss, if CHG gets in your ears and you have a perforated eardrum. Eye injury, if CHG gets in your eyes and is not rinsed out. The CHG product catching fire. Make sure that you avoid smoking and flames after applying CHG to your skin. Do not use CHG: If you have a chlorhexidine  allergy or have previously reacted to chlorhexidine. On babies younger than 12 months of age. How to use CHG solution Use CHG only as told by your health care provider, and follow the instructions on the label. Use the full amount of CHG as directed. Usually, this is one bottle. During a shower Follow these steps when using CHG solution during a shower (unless your health care provider gives you different instructions): Start the shower. Use your normal soap and shampoo to  wash your face and hair. Turn off the shower or move out of the shower stream. Pour the CHG onto a clean washcloth. Do not use any type of brush or rough-edged sponge. Starting at your neck, lather your body down to your toes. Make sure you follow these instructions: If you will be having surgery, pay special attention to the part of your body where you will be having surgery. Scrub this area for at least 1 minute. Do not use CHG on your head or face. If the solution gets into your ears or eyes, rinse them well with water. Avoid your genital area. Avoid any areas of skin that have broken skin, cuts, or scrapes. Scrub your back and under your arms. Make sure to wash skin folds. Let the lather sit on your skin for 1-2 minutes or as long as told by your health care provider. Thoroughly rinse your entire body in the shower. Make sure that all body creases and crevices are rinsed well. Dry off with a clean towel. Do not put any substances on your body afterward--such as powder, lotion, or perfume--unless you are told to do so by your health care provider. Only use lotions that are recommended by the manufacturer. Put on clean clothes or pajamas. If it is the night before your surgery, sleep in clean sheets.  During a sponge bath Follow these steps when using CHG solution during a sponge bath (unless your health care provider gives you different instructions): Use your normal soap and shampoo to wash your face and hair. Pour the  CHG onto a clean washcloth. Starting at your neck, lather your body down to your toes. Make sure you follow these instructions: If you will be having surgery, pay special attention to the part of your body where you will be having surgery. Scrub this area for at least 1 minute. Do not use CHG on your head or face. If the solution gets into your ears or eyes, rinse them well with water. Avoid your genital area. Avoid any areas of skin that have broken skin, cuts, or scrapes. Scrub your back and under your arms. Make sure to wash skin folds. Let the lather sit on your skin for 1-2 minutes or as long as told by your health care provider. Using a different clean, wet washcloth, thoroughly rinse your entire body. Make sure that all body creases and crevices are rinsed well. Dry off with a clean towel. Do not put any substances on your body afterward--such as powder, lotion, or perfume--unless you are told to do so by your health care provider. Only use lotions that are recommended by the manufacturer. Put on clean clothes or pajamas. If it is the night before your surgery, sleep in clean sheets. How to use CHG prepackaged cloths Only use CHG cloths as told by your health care provider, and follow the instructions on the label. Use the CHG cloth on clean, dry skin. Do not use the CHG cloth on your head or face unless your health care provider tells you to. When washing with the CHG cloth: Avoid your genital area. Avoid any areas of skin that have broken skin, cuts, or scrapes. Before surgery Follow these steps when using a CHG cloth to clean before surgery (unless your health care provider gives you different instructions): Using the CHG cloth, vigorously scrub the part of your body where you will be having surgery. Scrub using a back-and-forth motion for 3 minutes. The area on your body should be completely  wet with CHG when you are done scrubbing. Do not rinse. Discard the cloth and let the area  air-dry. Do not put any substances on the area afterward, such as powder, lotion, or perfume. Put on clean clothes or pajamas. If it is the night before your surgery, sleep in clean sheets.  For general bathing Follow these steps when using CHG cloths for general bathing (unless your health care provider gives you different instructions). Use a separate CHG cloth for each area of your body. Make sure you wash between any folds of skin and between your fingers and toes. Wash your body in the following order, switching to a new cloth after each step: The front of your neck, shoulders, and chest. Both of your arms, under your arms, and your hands. Your stomach and groin area, avoiding the genitals. Your right leg and foot. Your left leg and foot. The back of your neck, your back, and your buttocks. Do not rinse. Discard the cloth and let the area air-dry. Do not put any substances on your body afterward--such as powder, lotion, or perfume--unless you are told to do so by your health care provider. Only use lotions that are recommended by the manufacturer. Put on clean clothes or pajamas. Contact a health care provider if: Your skin gets irritated after scrubbing. You have questions about using your solution or cloth. You swallow any chlorhexidine. Call your local poison control center [phone])307-9401 in the [place] Get help right away if: Your eyes itch badly, or they become very red or swollen. Your skin itches badly and is red or swollen. Your hearing changes. You have trouble seeing. You have swelling or tingling in your mouth or throat. You have trouble breathing. These symptoms may represent a serious problem that is an emergency. Do not wait to see if the symptoms will go away. Get medical help right away. Call your local emergency services (911 in the [place] Do not drive yourself to the hospital. Summary Chlorhexidine gluconate (CHG) is a germ-killing (antiseptic) solution that is used  to clean the skin. Cleaning your skin with CHG may help to lower your risk for infection. You may be given CHG to use for bathing. It may be in a bottle or in a prepackaged cloth to use on your skin. Carefully follow your health care provider's instructions and the instructions on the product label. Do not use CHG if you have a chlorhexidine allergy. Contact your health care provider if your skin gets irritated after scrubbing. This information is not intended to replace advice given to you by your health care provider. Make sure you discuss any questions you have with your health care provider. Document Revised: 12/09/2021 Document Reviewed: 10/22/2020 Elsevier Patient Education  2023 [employer].

## 2023-06-08 ENCOUNTER — Encounter (HOSPITAL_COMMUNITY)
Admission: RE | Admit: 2023-06-08 | Discharge: 2023-06-08 | Disposition: A | Discharge status: Home / Self Care | Payer: Medicare Other | Source: Ambulatory Visit | Attending: Orthopedic Surgery | Admitting: Orthopedic Surgery

## 2023-06-08 ENCOUNTER — Encounter (HOSPITAL_COMMUNITY): Payer: Self-pay

## 2023-06-08 VITALS — BP 102/91 | HR 82 | Temp 99.0°F | Resp 18 | Ht 60.0 in | Wt 274.0 lb

## 2023-06-08 DIAGNOSIS — M65341 Trigger finger, right ring finger: Secondary | ICD-10-CM | POA: Diagnosis not present

## 2023-06-08 DIAGNOSIS — M65331 Trigger finger, right middle finger: Secondary | ICD-10-CM | POA: Insufficient documentation

## 2023-06-08 DIAGNOSIS — F17200 Nicotine dependence, unspecified, uncomplicated: Secondary | ICD-10-CM | POA: Insufficient documentation

## 2023-06-08 DIAGNOSIS — Z01818 Encounter for other preprocedural examination: Secondary | ICD-10-CM | POA: Diagnosis not present

## 2023-06-08 HISTORY — DX: Unspecified asthma, uncomplicated: J45.909

## 2023-06-08 HISTORY — DX: Pneumonia, unspecified organism: J18.9

## 2023-06-08 HISTORY — DX: Edema, unspecified: R60.9

## 2023-06-08 LAB — CBC WITH DIFFERENTIAL/PLATELET
Abs Immature Granulocytes: 0 10*3/uL (ref 0.00–0.07)
Basophils Absolute: 0 10*3/uL (ref 0.0–0.1)
Basophils Relative: 0 %
Eosinophils Absolute: 0.1 10*3/uL (ref 0.0–0.5)
Eosinophils Relative: 1 %
HCT: 43.6 % (ref 36.0–46.0)
Hemoglobin: 14.4 g/dL (ref 12.0–15.0)
Lymphocytes Relative: 38 %
Lymphs Abs: 3.8 10*3/uL (ref 0.7–4.0)
MCH: 32.4 pg (ref 26.0–34.0)
MCHC: 33 g/dL (ref 30.0–36.0)
MCV: 98 fL (ref 80.0–100.0)
Monocytes Absolute: 0.7 10*3/uL (ref 0.1–1.0)
Monocytes Relative: 7 %
Neutro Abs: 5.3 10*3/uL (ref 1.7–7.7)
Neutrophils Relative %: 54 %
Platelets: 141 10*3/uL — ABNORMAL LOW (ref 150–400)
RBC: 4.45 MIL/uL (ref 3.87–5.11)
RDW: 13.6 % (ref 11.5–15.5)
WBC: 9.9 10*3/uL (ref 4.0–10.5)
nRBC: 0 % (ref 0.0–0.2)

## 2023-06-08 LAB — BASIC METABOLIC PANEL
Anion gap: 6 (ref 5–15)
BUN: 8 mg/dL (ref 6–20)
CO2: 30 mmol/L (ref 22–32)
Calcium: 8.9 mg/dL (ref 8.9–10.3)
Chloride: 98 mmol/L (ref 98–111)
Creatinine, Ser: 0.67 mg/dL (ref 0.44–1.00)
GFR, Estimated: >60 mL/min (ref >60)
Glucose, Bld: 92 mg/dL (ref 70–99)
Potassium: 3.4 mmol/L — ABNORMAL LOW (ref 3.5–5.1)
Sodium: 134 mmol/L — ABNORMAL LOW (ref 135–145)

## 2023-06-10 ENCOUNTER — Encounter (HOSPITAL_COMMUNITY)
Admission: RE | Disposition: A | Discharge status: Home / Self Care | Payer: Self-pay | Source: Home / Self Care | Attending: Orthopedic Surgery

## 2023-06-10 ENCOUNTER — Encounter (HOSPITAL_COMMUNITY): Payer: Self-pay | Admitting: Orthopedic Surgery

## 2023-06-10 ENCOUNTER — Ambulatory Visit (HOSPITAL_BASED_OUTPATIENT_CLINIC_OR_DEPARTMENT_OTHER): Payer: Medicare Other | Admitting: Certified Registered Nurse Anesthetist

## 2023-06-10 ENCOUNTER — Ambulatory Visit (HOSPITAL_COMMUNITY): Payer: Medicare Other | Admitting: Certified Registered Nurse Anesthetist

## 2023-06-10 ENCOUNTER — Ambulatory Visit (HOSPITAL_COMMUNITY)
Admission: RE | Admit: 2023-06-10 | Discharge: 2023-06-10 | Disposition: A | Discharge status: Home / Self Care | Payer: Medicare Other | Attending: Orthopedic Surgery | Admitting: Orthopedic Surgery

## 2023-06-10 DIAGNOSIS — M65331 Trigger finger, right middle finger: Secondary | ICD-10-CM

## 2023-06-10 DIAGNOSIS — F419 Anxiety disorder, unspecified: Secondary | ICD-10-CM | POA: Diagnosis not present

## 2023-06-10 DIAGNOSIS — G43909 Migraine, unspecified, not intractable, without status migrainosus: Secondary | ICD-10-CM | POA: Diagnosis not present

## 2023-06-10 DIAGNOSIS — M65341 Trigger finger, right ring finger: Secondary | ICD-10-CM

## 2023-06-10 DIAGNOSIS — F17210 Nicotine dependence, cigarettes, uncomplicated: Secondary | ICD-10-CM | POA: Diagnosis not present

## 2023-06-10 DIAGNOSIS — F32A Depression, unspecified: Secondary | ICD-10-CM | POA: Insufficient documentation

## 2023-06-10 DIAGNOSIS — J45909 Unspecified asthma, uncomplicated: Secondary | ICD-10-CM | POA: Insufficient documentation

## 2023-06-10 HISTORY — PX: TRIGGER FINGER RELEASE: SHX641

## 2023-06-10 SURGERY — RELEASE, A1 PULLEY, FOR TRIGGER FINGER
Anesthesia: General | Site: Hand | Laterality: Right

## 2023-06-10 MED ORDER — LIDOCAINE HCL (CARDIAC) PF 100 MG/5ML IV SOSY
PREFILLED_SYRINGE | INTRAVENOUS | Status: DC | PRN
Start: 2023-06-10 — End: 2023-06-10
  Administered 2023-06-10: 40 mg via INTRATRACHEAL

## 2023-06-10 MED ORDER — CHLORHEXIDINE GLUCONATE 0.12 % MT SOLN
15.0000 mL | Freq: Once | OROMUCOSAL | Status: AC
  Administered 2023-06-10: 15 mL via OROMUCOSAL

## 2023-06-10 MED ORDER — FENTANYL CITRATE (PF) 100 MCG/2ML IJ SOLN
INTRAMUSCULAR | Status: DC | PRN
  Administered 2023-06-10 (×2): 25 ug via INTRAVENOUS
  Administered 2023-06-10: 50 ug via INTRAVENOUS

## 2023-06-10 MED ORDER — LIDOCAINE HCL (PF) 1 % IJ SOLN
INTRAMUSCULAR | Status: AC
  Filled 2023-06-10: qty 30

## 2023-06-10 MED ORDER — CEFAZOLIN SODIUM-DEXTROSE 2-4 GM/100ML-% IV SOLN
INTRAVENOUS | Status: AC
  Filled 2023-06-10: qty 100

## 2023-06-10 MED ORDER — STERILE WATER FOR IRRIGATION IR SOLN
Status: DC | PRN
Start: 2023-06-10 — End: 2023-06-10
  Administered 2023-06-10: 500 mL

## 2023-06-10 MED ORDER — FENTANYL CITRATE (PF) 250 MCG/5ML IJ SOLN
INTRAMUSCULAR | Status: AC
  Filled 2023-06-10: qty 5

## 2023-06-10 MED ORDER — LACTATED RINGERS IV SOLN: INTRAVENOUS | Status: DC

## 2023-06-10 MED ORDER — MIDAZOLAM HCL 2 MG/2ML IJ SOLN
INTRAMUSCULAR | Status: DC | PRN
  Administered 2023-06-10: 2 mg via INTRAVENOUS

## 2023-06-10 MED ORDER — CEFAZOLIN SODIUM-DEXTROSE 2-4 GM/100ML-% IV SOLN
2.0000 g | INTRAVENOUS | Status: AC
  Administered 2023-06-10: 2 g via INTRAVENOUS

## 2023-06-10 MED ORDER — LIDOCAINE HCL (PF) 1 % IJ SOLN
INTRAMUSCULAR | Status: DC | PRN
Start: 2023-06-10 — End: 2023-06-10
  Administered 2023-06-10: 4 mL

## 2023-06-10 MED ORDER — BUPIVACAINE HCL (PF) 0.5 % IJ SOLN
INTRAMUSCULAR | Status: DC | PRN
  Administered 2023-06-10: 10 mL

## 2023-06-10 MED ORDER — ORAL CARE MOUTH RINSE: 15.0000 mL | Freq: Once | OROMUCOSAL | Status: AC

## 2023-06-10 MED ORDER — MIDAZOLAM HCL 2 MG/2ML IJ SOLN
INTRAMUSCULAR | Status: AC
  Filled 2023-06-10: qty 2

## 2023-06-10 MED ORDER — PROPOFOL 500 MG/50ML IV EMUL
INTRAVENOUS | Status: DC | PRN
  Administered 2023-06-10: 50 ug/kg/min via INTRAVENOUS

## 2023-06-10 MED ORDER — BUPIVACAINE HCL (PF) 0.5 % IJ SOLN
INTRAMUSCULAR | Status: AC
  Filled 2023-06-10: qty 30

## 2023-06-10 MED ORDER — PROPOFOL 500 MG/50ML IV EMUL
INTRAVENOUS | Status: AC
  Filled 2023-06-10: qty 50

## 2023-06-10 MED ORDER — PROPOFOL 10 MG/ML IV BOLUS
INTRAVENOUS | Status: DC | PRN
  Administered 2023-06-10: 20 mg via INTRAVENOUS
  Administered 2023-06-10: 30 mg via INTRAVENOUS
  Administered 2023-06-10: 40 mg via INTRAVENOUS

## 2023-06-10 MED ORDER — ACETAMINOPHEN-CODEINE 300-30 MG PO TABS
1.0000 | ORAL_TABLET | Freq: Four times a day (QID) | ORAL | 0 refills | Status: AC | PRN
Start: 2023-06-10 — End: 2023-06-13

## 2023-06-10 SURGICAL SUPPLY — 44 items
APL PRP STRL LF DISP 70% ISPRP (MISCELLANEOUS) ×1
BANDAGE ESMARK 4X12 BL STRL LF (DISPOSABLE) ×1 IMPLANT
BLADE SURG 15 STRL LF DISP TIS (BLADE) ×1 IMPLANT
BLADE SURG 15 STRL SS (BLADE) ×1
BNDG CMPR 12X4 ELC STRL LF (DISPOSABLE) ×1
BNDG CMPR MD 5X2 ELC HKLP STRL (GAUZE/BANDAGES/DRESSINGS) ×1
BNDG CMPR STD VLCR NS LF 5.8X2 (GAUZE/BANDAGES/DRESSINGS) ×1
BNDG COHESIVE 3X5 TAN STRL LF (GAUZE/BANDAGES/DRESSINGS) ×1 IMPLANT
BNDG COHESIVE 4X5 TAN STRL (GAUZE/BANDAGES/DRESSINGS) ×1 IMPLANT
BNDG CONFORM 2 STRL LF (GAUZE/BANDAGES/DRESSINGS) ×1 IMPLANT
BNDG ELASTIC 2 VLCR STRL LF (GAUZE/BANDAGES/DRESSINGS) IMPLANT
BNDG ELASTIC 2X5.8 VLCR NS LF (GAUZE/BANDAGES/DRESSINGS) ×1 IMPLANT
BNDG ESMARK 4X12 BLUE STRL LF (DISPOSABLE) ×1
CHLORAPREP W/TINT 26 (MISCELLANEOUS) ×1 IMPLANT
CLOTH BEACON ORANGE TIMEOUT ST (SAFETY) ×1 IMPLANT
COVER LIGHT HANDLE STERIS (MISCELLANEOUS) ×2 IMPLANT
CUFF TOURN SGL QUICK 18X4 (TOURNIQUET CUFF) ×1 IMPLANT
DECANTER SPIKE VIAL GLASS SM (MISCELLANEOUS) ×1 IMPLANT
DRAPE HALF SHEET 40X57 (DRAPES) ×1 IMPLANT
ELECT NDL TIP 2.8 STRL (NEEDLE) ×1 IMPLANT
ELECT NEEDLE TIP 2.8 STRL (NEEDLE) ×1
ELECT REM PT RETURN 9FT ADLT (ELECTROSURGICAL) ×1
ELECTRODE REM PT RTRN 9FT ADLT (ELECTROSURGICAL) ×1 IMPLANT
GAUZE 4X4 16PLY ~~LOC~~+RFID DBL (SPONGE) ×1 IMPLANT
GAUZE SPONGE 2X2 STRL 8-PLY (GAUZE/BANDAGES/DRESSINGS) IMPLANT
GAUZE SPONGE 4X4 12PLY STRL (GAUZE/BANDAGES/DRESSINGS) ×1 IMPLANT
GAUZE XEROFORM 1X8 LF (GAUZE/BANDAGES/DRESSINGS) ×1 IMPLANT
GLOVE BIOGEL PI IND STRL 7.0 (GLOVE) ×2 IMPLANT
GLOVE SS N UNI LF 8.5 STRL (GLOVE) ×1 IMPLANT
GLOVE SURG POLYISO LF SZ8 (GLOVE) ×1 IMPLANT
GOWN STRL REUS W/TWL LRG LVL3 (GOWN DISPOSABLE) ×1 IMPLANT
GOWN STRL REUS W/TWL XL LVL3 (GOWN DISPOSABLE) ×1 IMPLANT
KIT TURNOVER KIT A (KITS) ×1 IMPLANT
MANIFOLD NEPTUNE II (INSTRUMENTS) ×1 IMPLANT
NDL HYPO 21X1.5 SAFETY (NEEDLE) ×1 IMPLANT
NEEDLE HYPO 21X1.5 SAFETY (NEEDLE) ×1
NS IRRIG 1000ML POUR BTL (IV SOLUTION) ×1 IMPLANT
PACK BASIC LIMB (CUSTOM PROCEDURE TRAY) ×1 IMPLANT
PAD ARMBOARD 7.5X6 YLW CONV (MISCELLANEOUS) ×1 IMPLANT
POSITIONER HAND ALUMI XLG (MISCELLANEOUS) ×1 IMPLANT
POSITIONER HEAD 8X9X4 ADT (SOFTGOODS) ×1 IMPLANT
SET BASIN LINEN APH (SET/KITS/TRAYS/PACK) ×1 IMPLANT
SUT ETHILON 3 0 FSL (SUTURE) ×1 IMPLANT
SYR CONTROL 10ML LL (SYRINGE) ×1 IMPLANT

## 2023-06-10 NOTE — Anesthesia Procedure Notes (Signed)
Procedure Name: MAC Date/Time: 06/10/2023 7:57 AM  Performed by: Lorin Glass, CRNAPre-anesthesia Checklist: Patient identified, Emergency Drugs available, Suction available and Patient being monitored Patient Re-evaluated:Patient Re-evaluated prior to induction Oxygen Delivery Method: Simple face mask Induction Type: IV induction

## 2023-06-10 NOTE — Interval H&P Note (Signed)
History and Physical Interval Note:  06/10/2023 7:22 AM  Danielle Montoya  has presented today for surgery, with the diagnosis of right ring and middle trigger finger.  The various methods of treatment have been discussed with the patient and family. After consideration of risks, benefits and other options for treatment, the patient has consented to  Procedure(s): RELEASE TRIGGER FINGER/A-1 PULLEY (Right) as a surgical intervention.  The patient's history has been reviewed, patient examined, no change in status, stable for surgery.  I have reviewed the patient's chart and labs.  Questions were answered to the patient's satisfaction.     Fuller [place]

## 2023-06-10 NOTE — Brief Op Note (Signed)
06/10/2023  8:36 AM  PATIENT:  Danielle Montoya  57 y.o. female  PRE-OPERATIVE DIAGNOSIS:  right ring and middle trigger finger  POST-OPERATIVE DIAGNOSIS:  right ring and middle trigger finger  PROCEDURE:  Procedure(s): RELEASE TRIGGER FINGER/A-1 PULLEY (Right)  SURGEON:  Surgeons and Role:    * Vickki Hearing, MD - Primary  PHYSICIAN ASSISTANT:   ASSISTANTS: none   ANESTHESIA:   MAC and lidocaine and marcaine   EBL:  none   BLOOD ADMINISTERED:none  DRAINS: none   LOCAL MEDICATIONS USED:  MARCAINE    and LIDOCAINE   SPECIMEN:  No Specimen  DISPOSITION OF SPECIMEN:  N/A  COUNTS:  YES  TOURNIQUET:   Total Tourniquet Time Documented: Upper Arm (Right) - 20 minutes Total: Upper Arm (Right) - 20 minutes   DICTATION: .Dragon Dictation  PLAN OF CARE: Discharge to home after PACU  PATIENT DISPOSITION:  PACU - hemodynamically stable.   Delay start of Pharmacological VTE agent (>24hrs) due to surgical blood loss or risk of bleeding: not applicable

## 2023-06-10 NOTE — Anesthesia Preprocedure Evaluation (Signed)
Anesthesia Evaluation  Patient identified by MRN, date of birth, ID band Patient awake    Reviewed: Allergy & Precautions, H&P , NPO status , Patient's Chart, lab work & pertinent test results, reviewed documented beta blocker date and time   Airway Mallampati: II  TM Distance: >3 FB Neck ROM: full    Dental no notable dental hx.    Pulmonary neg pulmonary ROS, asthma , pneumonia, Current Smoker and Patient abstained from smoking.   Pulmonary exam normal breath sounds clear to auscultation       Cardiovascular Exercise Tolerance: Good negative cardio ROS  Rhythm:regular Rate:Normal     Neuro/Psych  Headaches PSYCHIATRIC DISORDERS Anxiety Depression     Neuromuscular disease negative neurological ROS  negative psych ROS   GI/Hepatic negative GI ROS, Neg liver ROS,GERD  ,,  Endo/Other  negative endocrine ROS    Renal/GU negative Renal ROS  negative genitourinary   Musculoskeletal   Abdominal   Peds  Hematology negative hematology ROS (+)   Anesthesia Other Findings   Reproductive/Obstetrics negative OB ROS                             Anesthesia Physical Anesthesia Plan  ASA: 2  Anesthesia Plan: General and General LMA   Post-op Pain Management:    Induction:   PONV Risk Score and Plan: Ondansetron  Airway Management Planned:   Additional Equipment:   Intra-op Plan:   Post-operative Plan:   Informed Consent: I have reviewed the patients History and Physical, chart, labs and discussed the procedure including the risks, benefits and alternatives for the proposed anesthesia with the patient or authorized representative who has indicated his/her understanding and acceptance.     Dental Advisory Given  Plan Discussed with: CRNA  Anesthesia Plan Comments:        Anesthesia Quick Evaluation

## 2023-06-10 NOTE — Op Note (Signed)
06/10/2023  8:36 AM  PATIENT:  Danielle Montoya  57 y.o. female  PRE-OPERATIVE DIAGNOSIS:  right ring and middle trigger finger  POST-OPERATIVE DIAGNOSIS:  right ring and middle trigger finger  PROCEDURE:  Procedure(s): RELEASE TRIGGER FINGER/A-1 PULLEY (Right)  SURGEON:  Surgeons and Role:    * Vickki Hearing, MD - Primary  PHYSICIAN ASSISTANT:   ASSISTANTS: none   ANESTHESIA:   MAC and lidocaine and marcaine   EBL:  none   BLOOD ADMINISTERED:none  DRAINS: none   LOCAL MEDICATIONS USED:  MARCAINE    and LIDOCAINE   SPECIMEN:  No Specimen  DISPOSITION OF SPECIMEN:  N/A  COUNTS:  YES  TOURNIQUET:   Total Tourniquet Time Documented: Upper Arm (Right) - 20 minutes Total: Upper Arm (Right) - 20 minutes   DICTATION: .Dragon Dictation  PLAN OF CARE: Discharge to home after PACU  PATIENT DISPOSITION:  PACU - hemodynamically stable.   Delay start of Pharmacological VTE agent (>24hrs) due to surgical blood loss or risk of bleeding: not applicable  The patient was seen in preop cleared for surgery confirmed right long right ring finger surgery  Surgical site marked  Chart updated.  Patient was taken to the operating room giving IV MAC anesthesia with local  After sterile prep and drape and timeout the limb was exsanguinated the tourniquet was elevated to 250 mmHg.  Lidocaine was injected into both the ring and long finger of the right hand.  The first incision was longitudinal made over the ring finger the subcutaneous tissue was divided the neurovascular structures were protected the A1 pulley was exposed.  A blunt object was placed underneath it it was opened and continued division was performed with scissors.  The tendon was pulled out of the wound and there was no further locking or catching  This was repeated on the long finger.  Closure was performed with interrupted horizontal mattress suture with 3-0 nylon.  Each finger was then injected with  Marcaine.  The tourniquet was released and the wound was covered with a dressing and pressure was held over the incision.  Sterile dressing was then applied.  Good color and capillary refill was noted in each finger  The patient was then taken to recovery room in stable condition

## 2023-06-10 NOTE — H&P (Signed)
preop       Chief Complaint  Patient presents with   Hand Problem      Trigger finger right ring, ready to schedule surgery / last antibiotic dose is today      57 year old female with longstanding trigger fingers in the right hand.  She developed some type of allergic reaction to a trigger finger injection on the right hand and had to be treated with oral antibiotics and prednisone   She would like to have the right ring and right long finger released due to chronic pain catching and locking   Review of systems no chest pain shortness of breath or fever did not no blurred vision or headaches GI symptoms negative no GU symptoms skin is intact no numbness or tingling nervousness bleeding excessive thirst or food reactions  Past Medical History:  Diagnosis Date   Anxiety    Arthritis    right knee and left arm   Asthma    Depression    Edema    lower left leg from trauma   GERD (gastroesophageal reflux disease)    Headache(784.0)    Migraines   Pneumonia    Seasonal allergies    Torn muscle    Left arm   Past Surgical History:  Procedure Laterality Date   ABDOMINAL HYSTERECTOMY     arm surgery     BREAST CYST EXCISION Right 2023   CHOLECYSTECTOMY     dysfunctional   COLONOSCOPY  06/10/2012   Procedure: COLONOSCOPY;  Surgeon: Malissa Hippo, MD;  Location: [facility];  Service: Endoscopy;  Laterality: N/A;  1200   head  incision Right 12/2022   MICROLARYNGOSCOPY Right 09/15/2016   Procedure: RIGHT VOCAL CORD MICRO DIRECT LARYNGOSCOPY WITH BIOPSY;  Surgeon: Newman Pies, MD;  Location: [facility];  Service: ENT;  Laterality: Right;   TONSILLECTOMY     Family History  Problem Relation Age of Onset   Colon cancer Mother    Breast cancer Maternal Aunt    Social History   Tobacco Use   Smoking status: Every Day    Current packs/day: 1.00    Average packs/day: 1 pack/day for 32.0 years (32.0 ttl pk-yrs)    Types: Cigarettes   Smokeless tobacco: Never    Tobacco comments:    1/2 pack a day. Smoking since age 75.  Substance Use Topics   Alcohol use: No   Drug use: No   Current Outpatient Medications  Medication Instructions   albuterol (ACCUNEB) 0.63 MG/3ML nebulizer solution 1 ampule, Nebulization, Every 6 hours PRN   albuterol (PROVENTIL) 4 mg, Oral, 4 times daily PRN   butalbital-acetaminophen-caffeine (FIORICET, ESGIC) 50-325-40 MG per tablet 1 tablet, Every 6 hours PRN   cholecalciferol (VITAMIN D3) 1,000 Units, Oral, Daily   doxycycline (VIBRAMYCIN) 100 mg, Oral, 2 times daily   folic acid (FOLVITE) 1 mg, Oral, Daily   furosemide (LASIX) 20 mg, Oral, 2 times daily PRN   hydrocortisone 2.5 % cream 1 Application, Topical, 2 times daily PRN   hydrOXYzine (ATARAX) 10 mg, Oral, 3 times daily PRN   ketorolac (TORADOL) 10 mg, Oral, Every 8 hours PRN   LORazepam (ATIVAN) 2-2.5 mg, Oral, At bedtime PRN   Nurtec 75 mg, Oral, Every other day   promethazine (PHENERGAN) 25 mg, Oral, 4 times daily PRN   propranolol (INDERAL) 40 mg, Oral, Daily at bedtime   rOPINIRole (REQUIP) 1 mg, Oral, Daily at bedtime   rosuvastatin (CRESTOR) 10 mg, Oral, Daily  traZODone (DESYREL) 100 mg, Oral, Daily at bedtime      Allergies       Allergies  Allergen Reactions   Penicillins Anaphylaxis, Nausea And Vomiting and Rash   Prednisone Itching   Shellfish Allergy Nausea And Vomiting      All seafood causes patient to be extremely sick.   Aspirin Nausea And Vomiting and Rash   Erythromycin Nausea And Vomiting and Rash      BP 129/79   Pulse 69   Ht 5' (1.524 m)   Wt 180 lb (81.6 kg)   BMI 35.15 kg/m    General Appearance normal   She is oriented x 3   Mood and affect normal   Gait and Station no abnormalities   Pulse and perfusion are normal temperature normal no edema swelling or varicose veins in the right hand   Lymph nodes elbow normal   Sensation in the hand normal   Reflexes normal no pathologic reflexes   Coordination  balance normal   Right hand tenderness in the A1 pulley of the right ring and right long finger with full range of motion some catching and some locking no instability and tendon function normal extension and flexion       Encounter Diagnoses  Name Primary?   Pre-op exam Yes   Trigger finger, right ring finger     Trigger finger, right middle finger        Plan trigger finger release right ring finger right long finger

## 2023-06-10 NOTE — Transfer of Care (Signed)
mmediate Anesthesia Transfer of Care Note  Patient: Danielle Montoya  Procedure(s) Performed: RELEASE TRIGGER FINGER/A-1 PULLEY (Right: Hand)  Patient Location: Short Stay  Anesthesia Type:General  Level of Consciousness: awake and alert   Airway & Oxygen Therapy: Patient Spontanous Breathing  Post-op Assessment: Report given to RN and Post -op Vital signs reviewed and stable  Post vital signs: Reviewed and stable  Last Vitals:  Vitals Value Taken Time  BP 96/65 06/10/23 0840  Temp 97.8   Pulse 67 06/10/23 0840  Resp 14 06/10/23 0840  SpO2 91 % 06/10/23 0840    Last Pain:  Vitals:   06/10/23 0840  TempSrc:   PainSc: 0-No pain      Patients Stated Pain Goal: 5 (06/10/23 4696)  Complications: No notable events documented.

## 2023-06-11 NOTE — Anesthesia Postprocedure Evaluation (Signed)
Anesthesia Post Note  Patient: Danielle Montoya  Procedure(s) Performed: RELEASE TRIGGER FINGER/A-1 PULLEY (Right: Hand)  Patient location during evaluation: Phase II Anesthesia Type: General Level of consciousness: awake Pain management: pain level controlled Vital Signs Assessment: post-procedure vital signs reviewed and stable Respiratory status: spontaneous breathing and respiratory function stable Cardiovascular status: blood pressure returned to baseline and stable Postop Assessment: no headache and no apparent nausea or vomiting Anesthetic complications: no Comments: Late entry   No notable events documented.   Last Vitals:  Vitals:   06/10/23 0840 06/10/23 0843  BP: 96/65   Pulse: 67   Resp: 14   Temp:  36.6 C  SpO2: 91% 94%    Last Pain:  Vitals:   06/10/23 0840  TempSrc:   PainSc: 0-No pain                 Windell Norfolk

## 2023-06-12 ENCOUNTER — Encounter (HOSPITAL_COMMUNITY): Payer: Self-pay | Admitting: Orthopedic Surgery

## 2023-06-12 ENCOUNTER — Telehealth: Payer: Self-pay | Admitting: Orthopedic Surgery

## 2023-06-12 NOTE — Telephone Encounter (Signed)
Dr. Mort Sawyers pt - pt lvm wanting to know if she is supposed to use any Neosporin before she changes the bandages.

## 2023-06-12 NOTE — Telephone Encounter (Signed)
The number she left was [phone].  I tried calling it too and it says "Your call can not be completed at this time, please try later"

## 2023-06-12 NOTE — Telephone Encounter (Signed)
No she uses dry dressings bandaid  Is there another number ? This one disconnected,  Its on her instructions.

## 2023-06-25 ENCOUNTER — Ambulatory Visit: Payer: Medicare Other | Admitting: Orthopedic Surgery

## 2023-06-25 DIAGNOSIS — M65331 Trigger finger, right middle finger: Secondary | ICD-10-CM

## 2023-06-25 DIAGNOSIS — E559 Vitamin D deficiency, unspecified: Secondary | ICD-10-CM | POA: Diagnosis not present

## 2023-06-25 DIAGNOSIS — M65341 Trigger finger, right ring finger: Secondary | ICD-10-CM

## 2023-06-25 DIAGNOSIS — J449 Chronic obstructive pulmonary disease, unspecified: Secondary | ICD-10-CM | POA: Diagnosis not present

## 2023-06-25 NOTE — Progress Notes (Signed)
Chief Complaint  Patient presents with   Routine Post Op    R hand trigger release DOS 06/10/23   Post op   Trigger finger release right ring and right long  Sutures removed.  Excellent range of motion no pain  No signs of infection  Continue active range of motion follow-up in 2 weeks

## 2023-07-01 ENCOUNTER — Ambulatory Visit: Payer: Medicare Other | Admitting: Neurology

## 2023-07-07 DIAGNOSIS — M25472 Effusion, left ankle: Secondary | ICD-10-CM | POA: Diagnosis not present

## 2023-07-08 ENCOUNTER — Ambulatory Visit: Payer: Medicare Other | Admitting: Neurology

## 2023-07-09 ENCOUNTER — Ambulatory Visit (INDEPENDENT_AMBULATORY_CARE_PROVIDER_SITE_OTHER): Payer: Medicare Other | Admitting: Orthopedic Surgery

## 2023-07-09 DIAGNOSIS — G43011 Migraine without aura, intractable, with status migrainosus: Secondary | ICD-10-CM | POA: Diagnosis not present

## 2023-07-09 DIAGNOSIS — M65341 Trigger finger, right ring finger: Secondary | ICD-10-CM

## 2023-07-09 NOTE — Progress Notes (Signed)
   VISIT TYPE: POST OP VISIT   Chief Complaint  Patient presents with   Routine Post Op    Encounter Diagnosis  Name Primary?   Trigger finger, right ring finger and right long finger s/p release 06/10/23 Yes    PROCEDURE: Trigger finger release right ring finger and right long finger  DATE OF SURGERY: June 10, 2023  POV # 2   Current Outpatient Medications:    albuterol (ACCUNEB) 0.63 MG/3ML nebulizer solution, Take 1 ampule by nebulization every 6 (six) hours as needed for wheezing., Disp: , Rfl:    albuterol (PROVENTIL) 2 MG tablet, Take 4 mg by mouth 4 (four) times daily as needed for wheezing or shortness of breath., Disp: , Rfl:    butalbital-acetaminophen-caffeine (FIORICET, ESGIC) 50-325-40 MG per tablet, Take 1 tablet by mouth every 6 (six) hours as needed for headache or migraine., Disp: , Rfl:    cholecalciferol (VITAMIN D3) 25 MCG (1000 UNIT) tablet, Take 1,000 Units by mouth daily., Disp: , Rfl:    doxycycline (VIBRAMYCIN) 100 MG capsule, Take 1 capsule (100 mg total) by mouth 2 (two) times daily. (Patient not taking: Reported on 05/28/2023), Disp: 28 capsule, Rfl: 0   folic acid (FOLVITE) 1 MG tablet, Take 1 mg by mouth daily., Disp: , Rfl:    furosemide (LASIX) 20 MG tablet, Take 20 mg by mouth 2 (two) times daily as needed for edema., Disp: , Rfl:    hydrocortisone 2.5 % cream, Apply 1 Application topically 2 (two) times daily as needed (irritation)., Disp: , Rfl:    hydrOXYzine (ATARAX) 10 MG tablet, Take 1 tablet (10 mg total) by mouth 3 (three) times daily as needed., Disp: 30 tablet, Rfl: 0   ketorolac (TORADOL) 10 MG tablet, Take 10 mg by mouth every 8 (eight) hours as needed for moderate pain or severe pain., Disp: , Rfl:    LORazepam (ATIVAN) 2 MG tablet, Take 2-2.5 mg by mouth at bedtime as needed for anxiety or sleep., Disp: , Rfl:    promethazine (PHENERGAN) 25 MG tablet, Take 25 mg by mouth 4 (four) times daily as needed for nausea or vomiting., Disp: ,  Rfl:    propranolol (INDERAL) 20 MG tablet, Take 40 mg by mouth at bedtime., Disp: , Rfl:    Rimegepant Sulfate (NURTEC) 75 MG TBDP, Take 75 mg by mouth every other day., Disp: , Rfl:    rOPINIRole (REQUIP) 1 MG tablet, Take 1 mg by mouth at bedtime., Disp: , Rfl:    rosuvastatin (CRESTOR) 10 MG tablet, Take 10 mg by mouth daily., Disp: , Rfl:    traZODone (DESYREL) 100 MG tablet, Take 100 mg by mouth at bedtime., Disp: , Rfl:    Subjective   ASSESSMENT AND PLAN   Doing well at this point the only problem is that there may be a's piece of suture left we will keep an eye on it if it starts to come out and the patient cannot get it out we advised to come in and let us try to get it out.

## 2023-08-05 DIAGNOSIS — G43109 Migraine with aura, not intractable, without status migrainosus: Secondary | ICD-10-CM | POA: Diagnosis not present

## 2023-08-20 DIAGNOSIS — H5203 Hypermetropia, bilateral: Secondary | ICD-10-CM | POA: Diagnosis not present

## 2023-09-02 DIAGNOSIS — G43011 Migraine without aura, intractable, with status migrainosus: Secondary | ICD-10-CM | POA: Diagnosis not present

## 2023-09-15 DIAGNOSIS — G43109 Migraine with aura, not intractable, without status migrainosus: Secondary | ICD-10-CM | POA: Diagnosis not present

## 2023-09-30 DIAGNOSIS — K080 Exfoliation of teeth due to systemic causes: Secondary | ICD-10-CM | POA: Diagnosis not present

## 2023-10-05 DIAGNOSIS — G894 Chronic pain syndrome: Secondary | ICD-10-CM | POA: Diagnosis not present

## 2023-10-05 DIAGNOSIS — G43011 Migraine without aura, intractable, with status migrainosus: Secondary | ICD-10-CM | POA: Diagnosis not present

## 2023-10-05 DIAGNOSIS — J449 Chronic obstructive pulmonary disease, unspecified: Secondary | ICD-10-CM | POA: Diagnosis not present

## 2023-10-05 DIAGNOSIS — R053 Chronic cough: Secondary | ICD-10-CM | POA: Diagnosis not present

## 2023-10-05 DIAGNOSIS — E6609 Other obesity due to excess calories: Secondary | ICD-10-CM | POA: Diagnosis not present

## 2023-10-05 DIAGNOSIS — Z6835 Body mass index (BMI) 35.0-35.9, adult: Secondary | ICD-10-CM | POA: Diagnosis not present

## 2023-10-14 DIAGNOSIS — K080 Exfoliation of teeth due to systemic causes: Secondary | ICD-10-CM | POA: Diagnosis not present

## 2023-10-15 ENCOUNTER — Ambulatory Visit: Payer: Medicare Other | Admitting: Neurology

## 2023-11-05 DIAGNOSIS — G43011 Migraine without aura, intractable, with status migrainosus: Secondary | ICD-10-CM | POA: Diagnosis not present

## 2023-11-27 DIAGNOSIS — Z0001 Encounter for general adult medical examination with abnormal findings: Secondary | ICD-10-CM | POA: Diagnosis not present

## 2023-11-27 DIAGNOSIS — Z9229 Personal history of other drug therapy: Secondary | ICD-10-CM | POA: Diagnosis not present

## 2023-11-27 DIAGNOSIS — E6609 Other obesity due to excess calories: Secondary | ICD-10-CM | POA: Diagnosis not present

## 2023-11-27 DIAGNOSIS — R051 Acute cough: Secondary | ICD-10-CM | POA: Diagnosis not present

## 2023-11-27 DIAGNOSIS — Z1331 Encounter for screening for depression: Secondary | ICD-10-CM | POA: Diagnosis not present

## 2023-11-27 DIAGNOSIS — J9801 Acute bronchospasm: Secondary | ICD-10-CM | POA: Diagnosis not present

## 2023-11-27 DIAGNOSIS — G43109 Migraine with aura, not intractable, without status migrainosus: Secondary | ICD-10-CM | POA: Diagnosis not present

## 2023-11-27 DIAGNOSIS — J449 Chronic obstructive pulmonary disease, unspecified: Secondary | ICD-10-CM | POA: Diagnosis not present

## 2023-11-27 DIAGNOSIS — F419 Anxiety disorder, unspecified: Secondary | ICD-10-CM | POA: Diagnosis not present

## 2023-11-27 DIAGNOSIS — G894 Chronic pain syndrome: Secondary | ICD-10-CM | POA: Diagnosis not present

## 2023-11-27 DIAGNOSIS — J45909 Unspecified asthma, uncomplicated: Secondary | ICD-10-CM | POA: Diagnosis not present

## 2023-11-27 DIAGNOSIS — Z6834 Body mass index (BMI) 34.0-34.9, adult: Secondary | ICD-10-CM | POA: Diagnosis not present

## 2023-11-27 DIAGNOSIS — I872 Venous insufficiency (chronic) (peripheral): Secondary | ICD-10-CM | POA: Diagnosis not present

## 2023-12-21 DIAGNOSIS — G43011 Migraine without aura, intractable, with status migrainosus: Secondary | ICD-10-CM | POA: Diagnosis not present

## 2023-12-23 DIAGNOSIS — J449 Chronic obstructive pulmonary disease, unspecified: Secondary | ICD-10-CM | POA: Diagnosis not present

## 2023-12-23 DIAGNOSIS — G43109 Migraine with aura, not intractable, without status migrainosus: Secondary | ICD-10-CM | POA: Diagnosis not present

## 2024-01-06 DIAGNOSIS — G43109 Migraine with aura, not intractable, without status migrainosus: Secondary | ICD-10-CM | POA: Diagnosis not present

## 2024-01-21 ENCOUNTER — Other Ambulatory Visit: Payer: Self-pay | Admitting: Internal Medicine

## 2024-01-21 DIAGNOSIS — Z1231 Encounter for screening mammogram for malignant neoplasm of breast: Secondary | ICD-10-CM

## 2024-01-27 ENCOUNTER — Ambulatory Visit
Admission: RE | Admit: 2024-01-27 | Discharge: 2024-01-27 | Disposition: A | Discharge status: Home / Self Care | Source: Ambulatory Visit | Attending: Internal Medicine | Admitting: Internal Medicine

## 2024-01-27 DIAGNOSIS — Z1231 Encounter for screening mammogram for malignant neoplasm of breast: Secondary | ICD-10-CM | POA: Diagnosis not present

## 2024-01-27 DIAGNOSIS — G43109 Migraine with aura, not intractable, without status migrainosus: Secondary | ICD-10-CM | POA: Diagnosis not present

## 2024-02-15 DIAGNOSIS — G43011 Migraine without aura, intractable, with status migrainosus: Secondary | ICD-10-CM | POA: Diagnosis not present

## 2024-03-14 DIAGNOSIS — G43109 Migraine with aura, not intractable, without status migrainosus: Secondary | ICD-10-CM | POA: Diagnosis not present

## 2024-03-30 DIAGNOSIS — G43109 Migraine with aura, not intractable, without status migrainosus: Secondary | ICD-10-CM | POA: Diagnosis not present

## 2024-04-12 DIAGNOSIS — G43011 Migraine without aura, intractable, with status migrainosus: Secondary | ICD-10-CM | POA: Diagnosis not present

## 2024-05-05 DIAGNOSIS — G43909 Migraine, unspecified, not intractable, without status migrainosus: Secondary | ICD-10-CM | POA: Diagnosis not present

## 2024-05-05 DIAGNOSIS — J45909 Unspecified asthma, uncomplicated: Secondary | ICD-10-CM | POA: Diagnosis not present

## 2024-05-05 DIAGNOSIS — E7800 Pure hypercholesterolemia, unspecified: Secondary | ICD-10-CM | POA: Diagnosis not present

## 2024-05-05 DIAGNOSIS — F17210 Nicotine dependence, cigarettes, uncomplicated: Secondary | ICD-10-CM | POA: Diagnosis not present

## 2024-05-05 DIAGNOSIS — F32A Depression, unspecified: Secondary | ICD-10-CM | POA: Diagnosis not present

## 2024-05-05 DIAGNOSIS — Z299 Encounter for prophylactic measures, unspecified: Secondary | ICD-10-CM | POA: Diagnosis not present

## 2024-05-06 ENCOUNTER — Other Ambulatory Visit (HOSPITAL_COMMUNITY): Payer: Self-pay | Admitting: Internal Medicine

## 2024-05-06 DIAGNOSIS — D696 Thrombocytopenia, unspecified: Secondary | ICD-10-CM

## 2024-05-06 DIAGNOSIS — K760 Fatty (change of) liver, not elsewhere classified: Secondary | ICD-10-CM

## 2024-05-17 ENCOUNTER — Ambulatory Visit (HOSPITAL_COMMUNITY)
Admission: RE | Admit: 2024-05-17 | Discharge: 2024-05-17 | Disposition: A | Discharge status: Home / Self Care | Source: Ambulatory Visit | Attending: Internal Medicine | Admitting: Internal Medicine

## 2024-05-17 DIAGNOSIS — K760 Fatty (change of) liver, not elsewhere classified: Secondary | ICD-10-CM | POA: Diagnosis present

## 2024-05-17 DIAGNOSIS — D696 Thrombocytopenia, unspecified: Secondary | ICD-10-CM | POA: Diagnosis present

## 2024-06-15 ENCOUNTER — Other Ambulatory Visit (INDEPENDENT_AMBULATORY_CARE_PROVIDER_SITE_OTHER)

## 2024-06-15 ENCOUNTER — Other Ambulatory Visit: Payer: Self-pay

## 2024-06-15 ENCOUNTER — Ambulatory Visit: Admitting: Orthopedic Surgery

## 2024-06-15 VITALS — BP 126/82 | HR 83 | Ht 60.0 in | Wt 199.0 lb

## 2024-06-15 DIAGNOSIS — S93402A Sprain of unspecified ligament of left ankle, initial encounter: Secondary | ICD-10-CM

## 2024-06-15 DIAGNOSIS — M79672 Pain in left foot: Secondary | ICD-10-CM | POA: Diagnosis not present

## 2024-06-15 DIAGNOSIS — M79671 Pain in right foot: Secondary | ICD-10-CM

## 2024-06-15 DIAGNOSIS — W19XXXA Unspecified fall, initial encounter: Secondary | ICD-10-CM

## 2024-06-15 DIAGNOSIS — S335XXA Sprain of ligaments of lumbar spine, initial encounter: Secondary | ICD-10-CM | POA: Diagnosis not present

## 2024-06-15 DIAGNOSIS — M545 Low back pain, unspecified: Secondary | ICD-10-CM

## 2024-06-15 DIAGNOSIS — M25551 Pain in right hip: Secondary | ICD-10-CM

## 2024-06-15 MED ORDER — METHOCARBAMOL 500 MG PO TABS: 500.0000 mg | ORAL_TABLET | Freq: Three times a day (TID) | ORAL | 1 refills | Status: DC

## 2024-06-15 MED ORDER — IBUPROFEN 800 MG PO TABS: 800.0000 mg | ORAL_TABLET | Freq: Three times a day (TID) | ORAL | 1 refills | Status: AC | PRN

## 2024-06-15 NOTE — Patient Instructions (Signed)
 Epsom salt bath left ankle 30 minutes daily  Heating pad right hip and lower back 3 times a day for 30 minutes  Start Robaxin 3 times a day Continue ibuprofen 800 mg 3 times a day

## 2024-06-15 NOTE — Progress Notes (Signed)
     Chief Complaint  Patient presents with   Back Pain   Foot Swelling    Bilateral foot and ankle pain bilateral foot and ankle swelling   Right hip    Right hip pain radiating to right knee   Approxi-3 days ago this 58 year old female fell and injured her right hip lower back bilateral feet including swelling and pain in the left ankle and foot  She had some mild discomfort in the right lower back and right hip prior to the fall but it worsened after the fall  Review of systems bowel and bladder function intact no numbness or tingling below the knee  BP 126/82   Pulse 83   Ht 5' (1.524 m)   Wt 199 lb (90.3 kg)   BMI 38.86 kg/m   She is awake alert and oriented x 3  Her mood is pleasant her affect is flat  She has tenderness in the lower back midline and right side  Hip range of motion and straight leg raises are negative  Both legs show some edema but the left leg shows swelling and edema and tenderness consistent with a left ankle sprain Slight range of motion deficit  No neurologic or motor abnormalities  DG Foot Complete Left Result Date: 06/15/2024 Pain left foot normal articulations of the bony anatomy of the left foot with no evidence of fracture or malalignment As seen on the right foot posterior calcaneal calcification and plantar spur midfoot looks normal Impression normal left foot   DG Foot Complete Right Result Date: 06/15/2024 X-ray right foot patient complains of right foot pain no bony fractures are seen overall alignment looks normalHallux valgus angle looks normal irregularity of the proximal fifth metatarsal appears to be an old avulsion fracture.  Calcaneal posterior calcification and plantar spur midfoot looks normal Impression normal right foot   DG Pelvis 1-2 Views Result Date: 06/15/2024 Hip pain Normal bony articulations around the hip joint bilaterally with no evidence of arthritic changes or femoral head abnormality Normal hip and pelvis    DG Lumbar Spine 2-3 Views Result Date: 06/15/2024 Spinal imaging back pain hip pain There is abnormal alignment of the lumbar spine on the coronal x-ray The disc spaces look preserved although there are endplate irregularities which appear to be chronic at L1 1 and L2 We do see some mild endplate irregularities at L4 and L5 with some facet arthritis at L4-5 Impression L4-5 facet arthritis, mild spondylosis mild coronal plane malalignment     Encounter Diagnoses  Name Primary?   Bilateral foot pain    Sprain lumbar region, initial encounter Yes   Pain in right hip    Moderate left ankle sprain, initial encounter     Recommend ASO brace for 4 weeks left ankle and salt soaks daily  Recommend Robaxin and ibuprofen for right hip and lower back pain  Return in 4 weeks

## 2024-06-15 NOTE — Progress Notes (Signed)
  Intake history:  Chief Complaint  Patient presents with   Back Pain     Ht 5' (1.524 m)   Wt 199 lb (90.3 kg)   BMI 38.86 kg/m  Body mass index is 38.86 kg/m.  Pharmacy? __[facility]_  WHAT ARE WE SEEING YOU FOR TODAY?   back - right lower back  Radiation?: yes - R Leg to knee.   Loss of bowel/urine control?  no  How long has this bothered you? (DOI?DOS?WS?)  on 06/12/24  Was there an injury? Yes  Anticoag.  No  Diabetes No  Heart disease No  Hypertension Yes  SMOKING HX Yes  Kidney disease No  Any ALLERGIES ______________________________________________   Treatment:  Have you taken:  Tylenol  No  Advil Yes  Had PT No  Had injection No  Other  _________________________

## 2024-07-04 LAB — COLOGUARD: COLOGUARD: POSITIVE — AB

## 2024-07-07 ENCOUNTER — Ambulatory Visit: Admitting: Physician Assistant

## 2024-07-13 ENCOUNTER — Ambulatory Visit: Admitting: Orthopedic Surgery

## 2024-07-13 ENCOUNTER — Encounter: Payer: Self-pay | Admitting: Orthopedic Surgery

## 2024-07-13 ENCOUNTER — Ambulatory Visit (HOSPITAL_COMMUNITY): Admission: RE | Admit: 2024-07-13 | Source: Ambulatory Visit

## 2024-07-13 DIAGNOSIS — M5410 Radiculopathy, site unspecified: Secondary | ICD-10-CM | POA: Diagnosis not present

## 2024-07-13 DIAGNOSIS — S335XXA Sprain of ligaments of lumbar spine, initial encounter: Secondary | ICD-10-CM | POA: Diagnosis not present

## 2024-07-13 DIAGNOSIS — M7989 Other specified soft tissue disorders: Secondary | ICD-10-CM | POA: Diagnosis not present

## 2024-07-13 DIAGNOSIS — S93492D Sprain of other ligament of left ankle, subsequent encounter: Secondary | ICD-10-CM

## 2024-07-13 MED ORDER — KETOROLAC TROMETHAMINE 10 MG PO TABS: 10.0000 mg | ORAL_TABLET | Freq: Four times a day (QID) | ORAL | 0 refills | Status: DC | PRN

## 2024-07-13 NOTE — Progress Notes (Signed)
    07/13/2024   Chief Complaint  Patient presents with   Danielle Montoya 06/12/24 had back pain both ankle pain     No diagnosis found.  What pharmacy do you use ? [facility]  DOI/DOS/ Date: 06/12/24  Did you get better, worse or no change (Answer below)   Unchanged, has back pain lateral right hip pain and pain both ankles

## 2024-07-13 NOTE — Patient Instructions (Addendum)
 VISIT SUMMARY:  #1 arthritis in the lumbar area with pain rating into the foot so-called radiculopathy  Start physical therapy   #2 left ankle sprain still swollen recommend physical therapy   #3 left leg swelling rule out blood clot with ultrasound   For pain control continue Robaxin start Toradol  stop ibuprofen  physical therapy has been ordered for you at [facility]. They should call you to schedule, [phone] is the phone number to call, if you want to call to schedule.    You will be worked in today for the ultrasound they want you to go now to [facility] main entrance, then to Radiology, you may have a wait but they will work you in for the ultrasound to make sure you do not have a blood clot.

## 2024-07-13 NOTE — Progress Notes (Signed)
 FOLLOW-UP OFFICE VISIT   Patient: Danielle Montoya           Date of Birth: August 18, 1966           MRN: [id number] Visit Date: 07/13/2024 Requested by: Bertell Satterfield, MD [address] [place],  [place] [address] PCP: Bertell Satterfield, MD    No diagnosis found.  Chief Complaint  Patient presents with   Danielle Montoya 06/12/24 had back pain both ankle pain    Encounter Diagnoses  Name Primary?   Bilateral foot pain     Sprain lumbar region, initial encounter Yes   Pain in right hip     Moderate left ankle sprain, initial encounter        Recommend ASO brace for 4 weeks left ankle and salt soaks daily   Recommend Robaxin  and ibuprofen  for right hip and lower back pain   Return in 4 weeks  Approx-3 days ago this 58 year old female fell and injured her right hip lower back bilateral feet including swelling and pain in the left ankle and foot   She had some mild discomfort in the right lower back and right hip prior to the fall but it worsened after the fall  58 year old female still no improvement after falling still has bilateral ankle pain and right hip pain which is localized to her lower back and lateral right hip  Today Still having right lower back pain with radiation into the right leg and lateral ankle pain  Left ankle still swollen and tender   Left leg swelling up to the thigh calf nontender Homans' sign negative   ASSESSMENT AND PLAN 58 year old female with continued pain in her lower back and both foot and ankle   Encounter Diagnoses  Name Primary?   Sprain lumbar region, initial encounter    Left leg swelling    Back pain with radiculopathy Yes   Sprain of anterior talofibular ligament of left ankle, subsequent encounter     Recommend  NSAIDs : patient allergic to aspirin and prednisone   Physical therapy  Recheck in 6 weeks  Meds ordered this encounter  Medications   ketorolac  (TORADOL ) 10 MG tablet    Sig: Take 1 tablet (10 mg  total) by mouth every 6 (six) hours as needed.    Dispense:  20 tablet    Refill:  0    The patient gets Toradol  injections with no problem, does not tolerate prednisone  or aspirin breaks out in a rash    Ultrasound rule out DVT left leg

## 2024-07-14 ENCOUNTER — Ambulatory Visit (HOSPITAL_COMMUNITY)
Admission: RE | Admit: 2024-07-14 | Discharge: 2024-07-14 | Disposition: A | Discharge status: Home / Self Care | Source: Ambulatory Visit | Attending: Orthopedic Surgery | Admitting: Orthopedic Surgery

## 2024-07-14 ENCOUNTER — Telehealth: Payer: Self-pay | Admitting: Orthopedic Surgery

## 2024-07-14 DIAGNOSIS — M7989 Other specified soft tissue disorders: Secondary | ICD-10-CM | POA: Insufficient documentation

## 2024-07-14 NOTE — Telephone Encounter (Signed)
 I said it didn't need approval when I scheduled it   No one has reached out to me, I didn't know she didn't have study   To Dr Margrette LIPS  Her US  to R/O DVT was cancelled yesterday  Sari is working on getting it RS   It did not need approval, but they cancelled it for no approval

## 2024-07-14 NOTE — Telephone Encounter (Signed)
 Dr. Areatha pt - pt lvm stating she was calling to see if the insurance was straightened out so she can have her ultrasound.  She stated she is scheduled to have it today at 1pm.  She would like a call to let her know if this is a go or not.  [phone]

## 2024-07-14 NOTE — Telephone Encounter (Signed)
 It was to RO DVT   Does it need approval ?   I sch US  for yesterday had no Idea it was not done

## 2024-07-19 ENCOUNTER — Encounter (INDEPENDENT_AMBULATORY_CARE_PROVIDER_SITE_OTHER): Payer: Self-pay | Admitting: *Deleted

## 2024-07-23 ENCOUNTER — Other Ambulatory Visit: Payer: Self-pay | Admitting: Orthopedic Surgery

## 2024-07-23 DIAGNOSIS — S335XXA Sprain of ligaments of lumbar spine, initial encounter: Secondary | ICD-10-CM

## 2024-07-28 ENCOUNTER — Other Ambulatory Visit: Payer: Self-pay | Admitting: Orthopedic Surgery

## 2024-07-28 DIAGNOSIS — S335XXA Sprain of ligaments of lumbar spine, initial encounter: Secondary | ICD-10-CM

## 2024-07-28 MED ORDER — METHOCARBAMOL 500 MG PO TABS: 500.0000 mg | ORAL_TABLET | Freq: Three times a day (TID) | ORAL | 1 refills | Status: DC

## 2024-07-28 NOTE — Telephone Encounter (Signed)
 Needs refill on the Robaxin  to [facility]

## 2024-08-10 ENCOUNTER — Telehealth (INDEPENDENT_AMBULATORY_CARE_PROVIDER_SITE_OTHER): Payer: Self-pay

## 2024-08-10 ENCOUNTER — Encounter (INDEPENDENT_AMBULATORY_CARE_PROVIDER_SITE_OTHER): Payer: Self-pay | Admitting: Gastroenterology

## 2024-08-10 ENCOUNTER — Ambulatory Visit (INDEPENDENT_AMBULATORY_CARE_PROVIDER_SITE_OTHER): Admitting: Gastroenterology

## 2024-08-10 VITALS — BP 119/74 | HR 81 | Temp 97.4°F | Ht 61.0 in | Wt 194.5 lb

## 2024-08-10 DIAGNOSIS — D1803 Hemangioma of intra-abdominal structures: Secondary | ICD-10-CM | POA: Diagnosis not present

## 2024-08-10 DIAGNOSIS — Z6836 Body mass index (BMI) 36.0-36.9, adult: Secondary | ICD-10-CM | POA: Diagnosis not present

## 2024-08-10 DIAGNOSIS — D696 Thrombocytopenia, unspecified: Secondary | ICD-10-CM

## 2024-08-10 DIAGNOSIS — R195 Other fecal abnormalities: Secondary | ICD-10-CM | POA: Diagnosis not present

## 2024-08-10 DIAGNOSIS — Z791 Long term (current) use of non-steroidal anti-inflammatories (NSAID): Secondary | ICD-10-CM

## 2024-08-10 NOTE — Telephone Encounter (Signed)
 Spoke with patient in the office, scheduled colonoscopy for 09/02/2024 at 7:30am. Rx sent to pharmacy. Instructions given to patient.

## 2024-08-10 NOTE — H&P (View-Only) (Signed)
 Danielle Montoya Danielle Montoya , M.D. Gastroenterology & Hepatology [facility] Gastroenterology [address] [place], [place] [address] Primary Care Physician: Danielle Leta NOVAK, MD [address] [place] [address]  Chief Complaint:  positive cologuard   History of Present Illness: Danielle Montoya is a 58 y.o. female with history of migraines on chronic NSAIDs (Toradol  ), chronic thrombocytopenia who presents for evaluation of positive Cologuard  Patient reports she has weakness fresh blood upon wiping and attributed to low platelets.  Record for low platelets were investigated at [facility] and were deemed idiopathic Colonoscopy on 06/10/2012 showed normal terminal ileum, scattered erosions in the cecum and transverse colon, few small sigmoid diverticula, and small external hemorrhoids. Biopsies demonstrated nonspecific inflammation, felt to be secondary to NSAID use or infection. Patient was started on dicyclomine .  The patient denies having any nausea, vomiting, fever, chills, hematochezia, melena, hematemesis, abdominal distention, abdominal pain, diarrhea, jaundice, pruritus or weight loss.  Last ZHI:wnwz Last Colonoscopy:2013.  Positive Cologuard 06/2024  Labs hemoglobin 13.4 platelet 112 normal liver enzymes hemoglobin A1c 5.8 vitamin D 25 FHx: neg for any gastrointestinal/liver disease, no malignancies  Past Medical History: Past Medical History:  Diagnosis Date   Anxiety    Arthritis    right knee and left arm   Asthma    Depression    Edema    lower left leg from trauma   GERD (gastroesophageal reflux disease)    Headache(784.0)    Migraines   Pneumonia    Seasonal allergies    Torn muscle    Left arm    Past Surgical History: Past Surgical History:  Procedure Laterality Date   ABDOMINAL HYSTERECTOMY     arm surgery     BREAST CYST EXCISION Right 2023   CHOLECYSTECTOMY     dysfunctional   COLONOSCOPY  06/10/2012   Procedure: COLONOSCOPY;  Surgeon:  Danielle RAYMOND Rivet, MD;  [facility];  Service: Endoscopy;  Laterality: N/A;  1200   head  incision Right 12/2022   MICROLARYNGOSCOPY Right 09/15/2016   Procedure: RIGHT VOCAL CORD MICRO DIRECT LARYNGOSCOPY WITH BIOPSY;  Surgeon: Danielle Moccasin, MD;  Location: [facility];  Service: ENT;  Laterality: Right;   TONSILLECTOMY     TRIGGER FINGER RELEASE Right 06/10/2023   Procedure: RELEASE TRIGGER FINGER/A-1 PULLEY;  Surgeon: Danielle Taft BRAVO, MD;  Location: [facility];  Service: Orthopedics;  Laterality: Right;    Family History: Family History  Problem Relation Age of Onset   Colon cancer Mother    Breast cancer Maternal Aunt     Social History:Tobacco Use History[1] Social History   Substance and Sexual Activity  Alcohol Use No   Social History   Substance and Sexual Activity  Drug Use No    Allergies: Allergies[2]  Medications: Current Outpatient Medications  Medication Sig Dispense Refill   albuterol  (ACCUNEB ) 0.63 MG/3ML nebulizer solution Take 1 ampule by nebulization every 6 (six) hours as needed for wheezing.     albuterol  (PROVENTIL ) 2 MG tablet Take 4 mg by mouth 4 (four) times daily as needed for wheezing or shortness of breath.     amitriptyline (ELAVIL) 100 MG tablet Take 100 mg by mouth at bedtime.     butalbital-acetaminophen -caffeine (FIORICET, ESGIC) 50-325-40 MG per tablet Take 1 tablet by mouth every 6 (six) hours as needed for headache or migraine.     cholecalciferol (VITAMIN D3) 25 MCG (1000 UNIT) tablet Take 1,000 Units by mouth daily.  folic acid  (FOLVITE ) 1 MG tablet Take 1 mg by mouth daily.     furosemide (LASIX) 20 MG tablet Take 20 mg by mouth 2 (two) times daily as needed for edema.     hydrocortisone 2.5 % cream Apply 1 Application topically 2 (two) times daily as needed (irritation).     ibuprofen  (ADVIL ) 800 MG tablet Take 1 tablet (800 mg total) by mouth every 8 (eight) hours as needed. 90 tablet 1   ketorolac  (TORADOL ) 10  MG tablet Take 1 tablet (10 mg total) by mouth every 6 (six) hours as needed. 20 tablet 0   LORazepam (ATIVAN) 2 MG tablet Take 2-2.5 mg by mouth at bedtime as needed for anxiety or sleep.     methocarbamol  (ROBAXIN ) 500 MG tablet Take 1 tablet (500 mg total) by mouth 3 (three) times daily. 60 tablet 1   promethazine  (PHENERGAN ) 25 MG tablet Take 25 mg by mouth 4 (four) times daily as needed for nausea or vomiting.     propranolol (INDERAL) 20 MG tablet Take 40 mg by mouth at bedtime.     Rimegepant Sulfate (NURTEC) 75 MG TBDP Take 75 mg by mouth every other day.     rOPINIRole (REQUIP) 1 MG tablet Take 1 mg by mouth at bedtime.     rosuvastatin (CRESTOR) 10 MG tablet Take 10 mg by mouth daily.     traZODone (DESYREL) 100 MG tablet Take 100 mg by mouth at bedtime.     No current facility-administered medications for this visit.    Review of Systems: GENERAL: negative for malaise, night sweats HEENT: No changes in hearing or vision, no nose bleeds or other nasal problems. NECK: Negative for lumps, goiter, pain and significant neck swelling RESPIRATORY: Negative for cough, wheezing CARDIOVASCULAR: Negative for chest pain, leg swelling, palpitations, orthopnea GI: SEE HPI MUSCULOSKELETAL: Negative for joint pain or swelling, back pain, and muscle pain. SKIN: Negative for lesions, rash HEMATOLOGY Negative for prolonged bleeding, bruising easily, and swollen nodes. ENDOCRINE: Negative for cold or heat intolerance, polyuria, polydipsia and goiter. NEURO: negative for tremor, gait imbalance, syncope and seizures. The remainder of the review of systems is noncontributory.   Physical Exam: BP 119/74   Pulse 81   Temp (!) 97.4 F (36.3 C)   Ht 5' 1 (1.549 m)   Wt 194 lb 8 oz (88.2 kg)   BMI 36.75 kg/m  GENERAL: The patient is AO x3, in no acute distress. HEENT: Head is normocephalic and atraumatic. EOMI are intact. Mouth is well hydrated and without lesions. NECK: Supple. No  masses LUNGS: Clear to auscultation. No presence of rhonchi/wheezing/rales. Adequate chest expansion HEART: RRR, normal s1 and s2. ABDOMEN: Soft, nontender, no guarding, no peritoneal signs, and nondistended. BS +. No masses.   Imaging/Labs: as above     Latest Ref Rng & Units 06/08/2023   11:50 AM 02/25/2018    5:22 PM 06/26/2014    8:52 PM  CBC  WBC 4.0 - 10.5 K/uL 9.9  9.9  9.2   Hemoglobin 12.0 - 15.0 g/dL 85.5  85.4  86.4   Hematocrit 36.0 - 46.0 % 43.6  42.4  40.9   Platelets 150 - 400 K/uL 141  161  200    No results found for: IRON, TIBC, FERRITIN  IMPRESSION: 1. There is a 4.3 cm echogenic mass in the RIGHT posterior liver which corresponds to a benign hemangioma on remote prior CT. 2. Otherwise unremarkable abdominal ultrasound.    I personally reviewed and  interpreted the available labs, imaging and endoscopic files.  Impression and Plan: Danielle Montoya is a 58 y.o. female with history of migraines on chronic NSAIDs (Toradol  ), chronic thrombocytopenia who presents for evaluation of positive Cologuard  #Positive Cologuard   Patient does not have any high risk factors for colorectal cancer malignancy.  She has been noticing fresh blood upon wiping. Discussed cologuard  results in detail, specifically what it means when the test is positive or negative.  Discussed that there is a possibility that even when the test is positive there may not be a polyp found on colonoscopy. More than 50% of the office visit was dedicated to discussing the procedure, including the day of and risks involved. Patient understands what the procedure involves including the benefits and any risks. Patient understands alternatives to the proposed procedure. Risks including (but not limited to) bleeding, tearing of the lining (perforation), rupture of adjacent organs, problems with heart and lung function, infection, and medication reactions. A small percentage of complications may require  surgery, hospitalization, repeat endoscopic procedure, and/or transfusion. A small percentage of polyps and other tumors may not be seen.  - Schedule colonoscopy  # Thrombocytopenia #Liver lesion   Patient reports thrombocytopenia was investigated at [facility] was deemed idiopathic.  Last liver imaging with hemangioma  #BMI  36       - walking at a brisk pace/biking at moderate intesity 2.5-5 hours per week     - use pedometer/step counter to track activity     - goal to lose 5-10% of initial body weight     - avoid suagry drinks and juices, use zero calorie beverages     - increase water  intake     - eat a low carb diet with plenty of veggies and fruit     - Get sufficient sleep 7-8 hrs nightly     - maitain active lifestyle     - avoid alcohol     - recommend 2-3 cups Coffee daily     - Counsel on lowering cholesterol by having a diet rich in vegetables,          protein (avoid red meats) and good fats(fish, salmon).       All questions were answered.      Izabell Schalk Danielle Akeel Reffner, MD Gastroenterology and Hepatology [facility]astroenterology   This chart has been completed using [facility] Dictation software, and while attempts have been made to ensure accuracy , certain words and phrases may not be transcribed as intended      [1]  Social History Tobacco Use  Smoking Status Every Day   Current packs/day: 1.00   Average packs/day: 1 pack/day for 32.0 years (32.0 ttl pk-yrs)   Types: Cigarettes  Smokeless Tobacco Never  Tobacco Comments   1/2 pack a day. Smoking since age 49.  [2]  Allergies Allergen Reactions   Penicillins Anaphylaxis, Nausea And Vomiting and Rash   Zofran  [Ondansetron  Hcl] Other (See Comments)    headache   Prednisone  Itching and Swelling   Shellfish Allergy Nausea And Vomiting    All seafood causes patient to be extremely sick.   Aspirin Nausea And Vomiting and Rash   Erythromycin Nausea And Vomiting and Rash

## 2024-08-10 NOTE — Progress Notes (Signed)
 Danielle Montoya , M.D. Gastroenterology & Hepatology [facility] Gastroenterology [address] [place], [place] [address] Primary Care Physician: [name], MD [address] [place] [address]  Chief Complaint:  positive cologuard   History of Present Illness: Danielle Montoya is a 58 y.o. female with history of migraines on chronic NSAIDs (Toradol  ), chronic thrombocytopenia who presents for evaluation of positive Cologuard  Patient reports she has weakness fresh blood upon wiping and attributed to low platelets.  Record for low platelets were investigated at [facility] and were deemed idiopathic Colonoscopy on 06/10/2012 showed normal terminal ileum, scattered erosions in the cecum and transverse colon, few small sigmoid diverticula, and small external hemorrhoids. Biopsies demonstrated nonspecific inflammation, felt to be secondary to NSAID use or infection. Patient was started on dicyclomine .  The patient denies having any nausea, vomiting, fever, chills, hematochezia, melena, hematemesis, abdominal distention, abdominal pain, diarrhea, jaundice, pruritus or weight loss.  Last ZHI:wnwz Last Colonoscopy:2013.  Positive Cologuard 06/2024  Labs hemoglobin 13.4 platelet 112 normal liver enzymes hemoglobin A1c 5.8 vitamin D 25 FHx: neg for any gastrointestinal/liver disease, no malignancies  Past Medical History: Past Medical History:  Diagnosis Date   Anxiety    Arthritis    right knee and left arm   Asthma    Depression    Edema    lower left leg from trauma   GERD (gastroesophageal reflux disease)    Headache(784.0)    Migraines   Pneumonia    Seasonal allergies    Torn muscle    Left arm    Past Surgical History: Past Surgical History:  Procedure Laterality Date   ABDOMINAL HYSTERECTOMY     arm surgery     BREAST CYST EXCISION Right 2023   CHOLECYSTECTOMY     dysfunctional   COLONOSCOPY  06/10/2012   Procedure: COLONOSCOPY;  Surgeon:  Claudis RAYMOND Rivet, MD;  [facility];  Service: Endoscopy;  Laterality: N/A;  1200   head  incision Right 12/2022   MICROLARYNGOSCOPY Right 09/15/2016   Procedure: RIGHT VOCAL CORD MICRO DIRECT LARYNGOSCOPY WITH BIOPSY;  Surgeon: Daniel Moccasin, MD;  Location: [facility];  Service: ENT;  Laterality: Right;   TONSILLECTOMY     TRIGGER FINGER RELEASE Right 06/10/2023   Procedure: RELEASE TRIGGER FINGER/A-1 PULLEY;  Surgeon: Margrette Taft BRAVO, MD;  Location: [facility];  Service: Orthopedics;  Laterality: Right;    Family History: Family History  Problem Relation Age of Onset   Colon cancer Mother    Breast cancer Maternal Aunt     Social History:Tobacco Use History[1] Social History   Substance and Sexual Activity  Alcohol Use No   Social History   Substance and Sexual Activity  Drug Use No    Allergies: Allergies[2]  Medications: Current Outpatient Medications  Medication Sig Dispense Refill   albuterol  (ACCUNEB ) 0.63 MG/3ML nebulizer solution Take 1 ampule by nebulization every 6 (six) hours as needed for wheezing.     albuterol  (PROVENTIL ) 2 MG tablet Take 4 mg by mouth 4 (four) times daily as needed for wheezing or shortness of breath.     amitriptyline (ELAVIL) 100 MG tablet Take 100 mg by mouth at bedtime.     butalbital-acetaminophen -caffeine (FIORICET, ESGIC) 50-325-40 MG per tablet Take 1 tablet by mouth every 6 (six) hours as needed for headache or migraine.     cholecalciferol (VITAMIN D3) 25 MCG (1000 UNIT) tablet Take 1,000 Units by mouth daily.  folic acid  (FOLVITE ) 1 MG tablet Take 1 mg by mouth daily.     furosemide (LASIX) 20 MG tablet Take 20 mg by mouth 2 (two) times daily as needed for edema.     hydrocortisone 2.5 % cream Apply 1 Application topically 2 (two) times daily as needed (irritation).     ibuprofen  (ADVIL ) 800 MG tablet Take 1 tablet (800 mg total) by mouth every 8 (eight) hours as needed. 90 tablet 1   ketorolac  (TORADOL ) 10  MG tablet Take 1 tablet (10 mg total) by mouth every 6 (six) hours as needed. 20 tablet 0   LORazepam (ATIVAN) 2 MG tablet Take 2-2.5 mg by mouth at bedtime as needed for anxiety or sleep.     methocarbamol  (ROBAXIN ) 500 MG tablet Take 1 tablet (500 mg total) by mouth 3 (three) times daily. 60 tablet 1   promethazine  (PHENERGAN ) 25 MG tablet Take 25 mg by mouth 4 (four) times daily as needed for nausea or vomiting.     propranolol (INDERAL) 20 MG tablet Take 40 mg by mouth at bedtime.     Rimegepant Sulfate (NURTEC) 75 MG TBDP Take 75 mg by mouth every other day.     rOPINIRole (REQUIP) 1 MG tablet Take 1 mg by mouth at bedtime.     rosuvastatin (CRESTOR) 10 MG tablet Take 10 mg by mouth daily.     traZODone (DESYREL) 100 MG tablet Take 100 mg by mouth at bedtime.     No current facility-administered medications for this visit.    Review of Systems: GENERAL: negative for malaise, night sweats HEENT: No changes in hearing or vision, no nose bleeds or other nasal problems. NECK: Negative for lumps, goiter, pain and significant neck swelling RESPIRATORY: Negative for cough, wheezing CARDIOVASCULAR: Negative for chest pain, leg swelling, palpitations, orthopnea GI: SEE HPI MUSCULOSKELETAL: Negative for joint pain or swelling, back pain, and muscle pain. SKIN: Negative for lesions, rash HEMATOLOGY Negative for prolonged bleeding, bruising easily, and swollen nodes. ENDOCRINE: Negative for cold or heat intolerance, polyuria, polydipsia and goiter. NEURO: negative for tremor, gait imbalance, syncope and seizures. The remainder of the review of systems is noncontributory.   Physical Exam: BP 119/74   Pulse 81   Temp (!) 97.4 F (36.3 C)   Ht 5' 1 (1.549 m)   Wt 194 lb 8 oz (88.2 kg)   BMI 36.75 kg/m  GENERAL: The patient is AO x3, in no acute distress. HEENT: Head is normocephalic and atraumatic. EOMI are intact. Mouth is well hydrated and without lesions. NECK: Supple. No  masses LUNGS: Clear to auscultation. No presence of rhonchi/wheezing/rales. Adequate chest expansion HEART: RRR, normal s1 and s2. ABDOMEN: Soft, nontender, no guarding, no peritoneal signs, and nondistended. BS +. No masses.   Imaging/Labs: as above     Latest Ref Rng & Units 06/08/2023   11:50 AM 02/25/2018    5:22 PM 06/26/2014    8:52 PM  CBC  WBC 4.0 - 10.5 K/uL 9.9  9.9  9.2   Hemoglobin 12.0 - 15.0 g/dL 85.5  85.4  86.4   Hematocrit 36.0 - 46.0 % 43.6  42.4  40.9   Platelets 150 - 400 K/uL 141  161  200    No results found for: IRON, TIBC, FERRITIN  IMPRESSION: 1. There is a 4.3 cm echogenic mass in the RIGHT posterior liver which corresponds to a benign hemangioma on remote prior CT. 2. Otherwise unremarkable abdominal ultrasound.    I personally reviewed and  interpreted the available labs, imaging and endoscopic files.  Impression and Plan: Mesha P Balboni is a 58 y.o. female with history of migraines on chronic NSAIDs (Toradol  ), chronic thrombocytopenia who presents for evaluation of positive Cologuard  #Positive Cologuard   Patient does not have any high risk factors for colorectal cancer malignancy.  She has been noticing fresh blood upon wiping. Discussed cologuard  results in detail, specifically what it means when the test is positive or negative.  Discussed that there is a possibility that even when the test is positive there may not be a polyp found on colonoscopy. More than 50% of the office visit was dedicated to discussing the procedure, including the day of and risks involved. Patient understands what the procedure involves including the benefits and any risks. Patient understands alternatives to the proposed procedure. Risks including (but not limited to) bleeding, tearing of the lining (perforation), rupture of adjacent organs, problems with heart and lung function, infection, and medication reactions. A small percentage of complications may require  surgery, hospitalization, repeat endoscopic procedure, and/or transfusion. A small percentage of polyps and other tumors may not be seen.  - Schedule colonoscopy  # Thrombocytopenia #Liver lesion   Patient reports thrombocytopenia was investigated at [facility] was deemed idiopathic.  Last liver imaging with hemangioma  #BMI  36       - walking at a brisk pace/biking at moderate intesity 2.5-5 hours per week     - use pedometer/step counter to track activity     - goal to lose 5-10% of initial body weight     - avoid suagry drinks and juices, use zero calorie beverages     - increase water  intake     - eat a low carb diet with plenty of veggies and fruit     - Get sufficient sleep 7-8 hrs nightly     - maitain active lifestyle     - avoid alcohol     - recommend 2-3 cups Coffee daily     - Counsel on lowering cholesterol by having a diet rich in vegetables,          protein (avoid red meats) and good fats(fish, salmon).       All questions were answered.      Izabell Schalk Faizan Akeel Reffner, MD Gastroenterology and Hepatology [facility] Gastroenterology   This chart has been completed using [facility] Dictation software, and while attempts have been made to ensure accuracy , certain words and phrases may not be transcribed as intended      [1]  Social History Tobacco Use  Smoking Status Every Day   Current packs/day: 1.00   Average packs/day: 1 pack/day for 32.0 years (32.0 ttl pk-yrs)   Types: Cigarettes  Smokeless Tobacco Never  Tobacco Comments   1/2 pack a day. Smoking since age 49.  [2]  Allergies Allergen Reactions   Penicillins Anaphylaxis, Nausea And Vomiting and Rash   Zofran  [Ondansetron  Hcl] Other (See Comments)    headache   Prednisone  Itching and Swelling   Shellfish Allergy Nausea And Vomiting    All seafood causes patient to be extremely sick.   Aspirin Nausea And Vomiting and Rash   Erythromycin Nausea And Vomiting and Rash

## 2024-08-10 NOTE — Telephone Encounter (Signed)
 Prior shara is not required on [facility] for colonoscopy.

## 2024-08-10 NOTE — Patient Instructions (Signed)
 It was very nice to meet you today, as dicussed with will plan for the following :  1) colonoscopy

## 2024-08-24 ENCOUNTER — Ambulatory Visit: Admitting: Orthopedic Surgery

## 2024-08-24 DIAGNOSIS — M7989 Other specified soft tissue disorders: Secondary | ICD-10-CM | POA: Diagnosis not present

## 2024-08-24 DIAGNOSIS — M5410 Radiculopathy, site unspecified: Secondary | ICD-10-CM

## 2024-08-24 DIAGNOSIS — M79671 Pain in right foot: Secondary | ICD-10-CM

## 2024-08-24 DIAGNOSIS — M79672 Pain in left foot: Secondary | ICD-10-CM

## 2024-08-24 DIAGNOSIS — S335XXA Sprain of ligaments of lumbar spine, initial encounter: Secondary | ICD-10-CM | POA: Diagnosis not present

## 2024-08-24 DIAGNOSIS — S93492D Sprain of other ligament of left ankle, subsequent encounter: Secondary | ICD-10-CM | POA: Diagnosis not present

## 2024-08-24 NOTE — Progress Notes (Signed)
" ° ° °  08/24/2024   Chief Complaint  Patient presents with   Back Pain   Ankle Pain    Bilateral     No diagnosis found.  What pharmacy do you use ? __________[facility] _________________  DOI/DOS/ Date: 06/12/24  Did you get better, worse or no change (Answer below)   Improved      "

## 2024-08-24 NOTE — Progress Notes (Signed)
" ° °  Chief Complaint  Patient presents with   Back Pain   Ankle Pain    Bilateral    Encounter Diagnoses  Name Primary?   Sprain lumbar region, initial encounter Yes   Back pain with radiculopathy    Left leg swelling    Sprain of anterior talofibular ligament of left ankle, subsequent encounter    Bilateral foot pain      Back Pain  Ankle Pain    58 year old female who fell on October 19 injuring her back and ankle had some pain in her right hip  She is now asymptomatic PHYSICAL EXAM:  Left ankle exam shows full range of motion stable drawer test normal strength  Lumbar spine mild tenderness midline at L4-L5 and slightly in the right buttock   Assessment and Plan:   Patient has recovered nicely from the injuries that she had she is not having any major symptoms now and can resume normal activities follow-up as needed  "

## 2024-08-29 NOTE — Telephone Encounter (Signed)
 Patient called with a few questions on clear liquids. I went over with her the clear liquids that she can and cannot have. Patient asked what if she can not get all of the prep down. I informed her that if she is not cleaned out when she goes in for the procedure then she will need to be rescheduled. I told the patient to be sure to drink plenty of water  to get her flushed out with her prep. Patient verbalized understanding.

## 2024-08-30 ENCOUNTER — Ambulatory Visit (HOSPITAL_COMMUNITY): Attending: Orthopedic Surgery

## 2024-08-31 ENCOUNTER — Other Ambulatory Visit: Payer: Self-pay | Admitting: Orthopedic Surgery

## 2024-08-31 DIAGNOSIS — M5410 Radiculopathy, site unspecified: Secondary | ICD-10-CM

## 2024-08-31 DIAGNOSIS — S93492D Sprain of other ligament of left ankle, subsequent encounter: Secondary | ICD-10-CM

## 2024-09-02 ENCOUNTER — Other Ambulatory Visit: Payer: Self-pay

## 2024-09-02 ENCOUNTER — Encounter (HOSPITAL_COMMUNITY)
Admission: RE | Disposition: A | Discharge status: Home / Self Care | Payer: Self-pay | Source: Home / Self Care | Attending: Gastroenterology

## 2024-09-02 ENCOUNTER — Encounter (HOSPITAL_COMMUNITY): Payer: Self-pay | Admitting: Gastroenterology

## 2024-09-02 ENCOUNTER — Ambulatory Visit (HOSPITAL_COMMUNITY): Admitting: Anesthesiology

## 2024-09-02 ENCOUNTER — Ambulatory Visit (HOSPITAL_COMMUNITY)
Admission: RE | Admit: 2024-09-02 | Discharge: 2024-09-02 | Disposition: A | Discharge status: Home / Self Care | Attending: Gastroenterology | Admitting: Gastroenterology

## 2024-09-02 DIAGNOSIS — K5730 Diverticulosis of large intestine without perforation or abscess without bleeding: Secondary | ICD-10-CM | POA: Diagnosis not present

## 2024-09-02 DIAGNOSIS — R195 Other fecal abnormalities: Secondary | ICD-10-CM | POA: Diagnosis not present

## 2024-09-02 DIAGNOSIS — F17200 Nicotine dependence, unspecified, uncomplicated: Secondary | ICD-10-CM | POA: Insufficient documentation

## 2024-09-02 DIAGNOSIS — Z1211 Encounter for screening for malignant neoplasm of colon: Secondary | ICD-10-CM | POA: Diagnosis present

## 2024-09-02 DIAGNOSIS — K219 Gastro-esophageal reflux disease without esophagitis: Secondary | ICD-10-CM | POA: Insufficient documentation

## 2024-09-02 DIAGNOSIS — K648 Other hemorrhoids: Secondary | ICD-10-CM | POA: Diagnosis not present

## 2024-09-02 DIAGNOSIS — J45909 Unspecified asthma, uncomplicated: Secondary | ICD-10-CM | POA: Diagnosis not present

## 2024-09-02 DIAGNOSIS — D123 Benign neoplasm of transverse colon: Secondary | ICD-10-CM

## 2024-09-02 HISTORY — PX: POLYPECTOMY: SHX149

## 2024-09-02 HISTORY — PX: COLONOSCOPY: SHX5424

## 2024-09-02 LAB — HM COLONOSCOPY

## 2024-09-02 SURGERY — COLONOSCOPY
Anesthesia: Monitor Anesthesia Care

## 2024-09-02 MED ORDER — LACTATED RINGERS IV SOLN: INTRAVENOUS | Status: DC

## 2024-09-02 MED ORDER — PROPOFOL 500 MG/50ML IV EMUL
INTRAVENOUS | Status: DC | PRN
  Administered 2024-09-02: 50 mg via INTRAVENOUS
  Administered 2024-09-02: 125 ug/kg/min via INTRAVENOUS
  Administered 2024-09-02: 80 mg via INTRAVENOUS
  Administered 2024-09-02: 30 mg via INTRAVENOUS

## 2024-09-02 MED ORDER — KETOROLAC TROMETHAMINE 30 MG/ML IJ SOLN
INTRAMUSCULAR | Status: DC | PRN
  Administered 2024-09-02: 30 mg via INTRAVENOUS

## 2024-09-02 MED ORDER — LIDOCAINE 2% (20 MG/ML) 5 ML SYRINGE
INTRAMUSCULAR | Status: DC | PRN
  Administered 2024-09-02: 60 mg via INTRAVENOUS

## 2024-09-02 MED ORDER — KETOROLAC TROMETHAMINE 30 MG/ML IJ SOLN
INTRAMUSCULAR | Status: AC
  Filled 2024-09-02: qty 1

## 2024-09-02 NOTE — Interval H&P Note (Signed)
 History and Physical Interval Note:  09/02/2024 7:31 AM  Danielle Montoya  has presented today for surgery, with the diagnosis of positive cologuard.  The various methods of treatment have been discussed with the patient and family. After consideration of risks, benefits and other options for treatment, the patient has consented to  Procedures with comments: COLONOSCOPY (N/A) - 7:30am, ASA any room as a surgical intervention.  The patient's history has been reviewed, patient examined, no change in status, stable for surgery.  I have reviewed the patient's chart and labs.  Questions were answered to the patient's satisfaction.     [name] Salisha Bardsley

## 2024-09-02 NOTE — Discharge Instructions (Signed)

## 2024-09-02 NOTE — Anesthesia Preprocedure Evaluation (Signed)
"                                    Anesthesia Evaluation  Patient identified by MRN, date of birth, ID band Patient awake    Reviewed: Allergy & Precautions, H&P , NPO status , Patient's Chart, lab work & pertinent test results, reviewed documented beta blocker date and time   Airway Mallampati: II  TM Distance: >3 FB Neck ROM: full    Dental no notable dental hx.    Pulmonary asthma , pneumonia, Current Smoker   Pulmonary exam normal breath sounds clear to auscultation       Cardiovascular Exercise Tolerance: Good hypertension, negative cardio ROS  Rhythm:regular Rate:Normal     Neuro/Psych  Headaches PSYCHIATRIC DISORDERS Anxiety Depression     Neuromuscular disease    GI/Hepatic Neg liver ROS,GERD  ,,  Endo/Other  negative endocrine ROS    Renal/GU negative Renal ROS  negative genitourinary   Musculoskeletal   Abdominal   Peds  Hematology negative hematology ROS (+)   Anesthesia Other Findings   Reproductive/Obstetrics negative OB ROS                              Anesthesia Physical Anesthesia Plan  ASA: 2  Anesthesia Plan: MAC   Post-op Pain Management:    Induction:   PONV Risk Score and Plan: Propofol  infusion  Airway Management Planned:   Additional Equipment:   Intra-op Plan:   Post-operative Plan:   Informed Consent: I have reviewed the patients History and Physical, chart, labs and discussed the procedure including the risks, benefits and alternatives for the proposed anesthesia with the patient or authorized representative who has indicated his/her understanding and acceptance.     Dental Advisory Given  Plan Discussed with: CRNA  Anesthesia Plan Comments:         Anesthesia Quick Evaluation  "

## 2024-09-02 NOTE — Anesthesia Procedure Notes (Signed)
 Date/Time: 09/02/2024 7:35 AM  Performed by: Para Jerelene CROME, CRNAOxygen Delivery Method: Nasal cannula

## 2024-09-02 NOTE — Op Note (Signed)
 [facility] Patient Name: Danielle Montoya Procedure Date: 09/02/2024 7:03 AM MRN: [id number] Date of Birth: Oct 10, 1965 Attending MD: Deatrice Dine , MD, [id number] CSN: [id number] Age: 59 Admit Type: Outpatient Procedure:                Colonoscopy Indications:              Positive Cologuard test Providers:                Deatrice Dine, MD, Devere Lodge, Chad Wilson,                            Technician Referring MD:              Medicines:                Monitored Anesthesia Care Complications:            No immediate complications. Estimated Blood Loss:     Estimated blood loss was minimal. Procedure:                Pre-Anesthesia Assessment:                           - Prior to the procedure, a History and Physical                            was performed, and patient medications and                            allergies were reviewed. The patient's tolerance of                            previous anesthesia was also reviewed. The risks                            and benefits of the procedure and the sedation                            options and risks were discussed with the patient.                            All questions were answered, and informed consent                            was obtained. Prior Anticoagulants: The patient has                            taken no anticoagulant or antiplatelet agents                            except for NSAID medication. ASA Grade Assessment:                            II - A patient with mild systemic disease. After  reviewing the risks and benefits, the patient was                            deemed in satisfactory condition to undergo the                            procedure.                           After obtaining informed consent, the colonoscope                            was passed under direct vision. Throughout the                            procedure, the patient's blood pressure, pulse, and                             oxygen  saturations were monitored continuously. The                            [id number]-[id number] ([id number]) Peds Colon was introduced                            through the anus and advanced to the the cecum,                            identified by appendiceal orifice and ileocecal                            valve. The colonoscopy was performed without                            difficulty. The patient tolerated the procedure                            well. The quality of the bowel preparation was                            evaluated using the BBPS [facility] Bowel Preparation                            Scale) with scores of: Right Colon = 3, Transverse                            Colon = 3 and Left Colon = 3 (entire mucosa seen                            well with no residual staining, small fragments of                            stool or opaque liquid). The total BBPS score  equals 9. The ileocecal valve, appendiceal orifice,                            and rectum were photographed. Scope In: 7:42:22 AM Scope Out: 8:02:39 AM Scope Withdrawal Time: 0 hours 16 minutes 40 seconds  Total Procedure Duration: 0 hours 20 minutes 17 seconds  Findings:      The perianal and digital rectal examinations were normal.      Two sessile polyps were found in the transverse colon. The polyps were 6       to 8 mm in size. These polyps were removed with a cold snare. Resection       and retrieval were complete.      A few medium-mouthed diverticula were found in the left colon.      Non-bleeding internal hemorrhoids were found during retroflexion. The       hemorrhoids were small. Impression:               - Two 6 to 8 mm polyps in the transverse colon,                            removed with a cold snare. Resected and retrieved.                           - Diverticulosis in the left colon.                           - Non-bleeding internal hemorrhoids. Moderate  Sedation:      Per Anesthesia Care Recommendation:           - Patient has a contact number available for                            emergencies. The signs and symptoms of potential                            delayed complications were discussed with the                            patient. Return to normal activities tomorrow.                            Written discharge instructions were provided to the                            patient.                           - Resume previous diet.                           - Continue present medications.                           - Await pathology results.                           - Repeat colonoscopy in  7 years for surveillance                            based on pathology results.                           - Return to primary care physician as previously                            scheduled.                           Note : patient was having migrane and on request                            was given Toradol  30mg  IV Procedure Code(s):        --- Professional ---                           [phone], Colonoscopy, flexible; with removal of                            tumor(s), polyp(s), or other lesion(s) by snare                            technique Diagnosis Code(s):        --- Professional ---                           D12.3, Benign neoplasm of transverse colon (hepatic                            flexure or splenic flexure)                           K64.8, Other hemorrhoids                           R19.5, Other fecal abnormalities                           K57.30, Diverticulosis of large intestine without                            perforation or abscess without bleeding CPT copyright 2022 [employer]. All rights reserved. The codes documented in this report are preliminary and upon coder review may  be revised to meet current compliance requirements. Deatrice Dine, MD Deatrice Dine, MD 09/02/2024 8:11:33 AM This report has  been signed electronically. Number of Addenda: 0

## 2024-09-02 NOTE — Transfer of Care (Addendum)
 Immediate Anesthesia Transfer of Care Note  Patient: Danielle Montoya  Procedure(s) Performed: COLONOSCOPY POLYPECTOMY, INTESTINE  Patient Location: Endoscopy Unit  Anesthesia Type:MAC  Level of Consciousness: drowsy and patient cooperative  Airway & Oxygen  Therapy: Patient Spontanous Breathing  Post-op Assessment: Report given to RN and Post -op Vital signs reviewed and stable  Post vital signs: Reviewed and stable  Last Vitals:  Vitals Value Taken Time  BP 100/49 09/02/24   0809  Temp 36.8 09/02/24   0809  Pulse 73 09/02/24   0809  Resp 22 09/02/24   0809  SpO2 95% 09/02/24   0809    Last Pain:  Vitals:   09/02/24 0656  TempSrc: Oral  PainSc:       Patients Stated Pain Goal: 6 (09/02/24 0645)  Complications: No notable events documented.

## 2024-09-03 NOTE — Anesthesia Postprocedure Evaluation (Signed)
"   Anesthesia Post Note  Patient: Danielle Montoya  Procedure(s) Performed: COLONOSCOPY POLYPECTOMY, INTESTINE  Patient location during evaluation: Phase II Anesthesia Type: MAC Level of consciousness: awake Pain management: pain level controlled Vital Signs Assessment: post-procedure vital signs reviewed and stable Respiratory status: spontaneous breathing and respiratory function stable Cardiovascular status: blood pressure returned to baseline and stable Postop Assessment: no headache and no apparent nausea or vomiting Anesthetic complications: no Comments: Late entry   No notable events documented.   Last Vitals:  Vitals:   09/02/24 0809 09/02/24 0813  BP: (!) 100/49 126/63  Pulse: 73 72  Resp: (!) 22 (!) 22  Temp: 36.8 C   SpO2: 95% 95%    Last Pain:  Vitals:   09/02/24 0813  TempSrc:   PainSc: 10-Worst pain ever                 Yvonna JINNY Bosworth      "

## 2024-09-05 ENCOUNTER — Encounter (HOSPITAL_COMMUNITY): Payer: Self-pay | Admitting: Gastroenterology

## 2024-09-05 ENCOUNTER — Other Ambulatory Visit: Payer: Self-pay | Admitting: Orthopedic Surgery

## 2024-09-05 DIAGNOSIS — S335XXA Sprain of ligaments of lumbar spine, initial encounter: Secondary | ICD-10-CM

## 2024-09-05 LAB — SURGICAL PATHOLOGY

## 2024-09-06 ENCOUNTER — Encounter (INDEPENDENT_AMBULATORY_CARE_PROVIDER_SITE_OTHER): Payer: Self-pay | Admitting: *Deleted

## 2024-09-12 ENCOUNTER — Ambulatory Visit (INDEPENDENT_AMBULATORY_CARE_PROVIDER_SITE_OTHER): Payer: Self-pay | Admitting: Gastroenterology

## 2024-09-12 NOTE — Progress Notes (Signed)
 7 yr TCS noted in recall Patient result letter mailed procedure note and pathology result faxed to PCP

## 2024-09-30 ENCOUNTER — Other Ambulatory Visit: Payer: Self-pay | Admitting: Orthopedic Surgery

## 2024-09-30 DIAGNOSIS — M5410 Radiculopathy, site unspecified: Secondary | ICD-10-CM

## 2024-09-30 DIAGNOSIS — S93492D Sprain of other ligament of left ankle, subsequent encounter: Secondary | ICD-10-CM
